# Patient Record
Sex: Male | Born: 1975 | Race: White | Hispanic: No | Marital: Married | Smoking: Former smoker
Health system: Southern US, Community
[De-identification: ages and names within clinical notes are randomized; demographics above are authoritative.]

## PROBLEM LIST (undated history)

## (undated) ENCOUNTER — Emergency Department: Admission: EM | Payer: No Typology Code available for payment source | Source: Home / Self Care

## (undated) DIAGNOSIS — M549 Dorsalgia, unspecified: Secondary | ICD-10-CM

## (undated) DIAGNOSIS — J449 Chronic obstructive pulmonary disease, unspecified: Secondary | ICD-10-CM

## (undated) DIAGNOSIS — I509 Heart failure, unspecified: Secondary | ICD-10-CM

## (undated) DIAGNOSIS — F419 Anxiety disorder, unspecified: Secondary | ICD-10-CM

## (undated) DIAGNOSIS — F4310 Post-traumatic stress disorder, unspecified: Secondary | ICD-10-CM

## (undated) HISTORY — PX: HAND SURGERY: SHX662

## (undated) HISTORY — PX: FACIAL COSMETIC SURGERY: SHX629

## (undated) HISTORY — PX: BACK SURGERY: SHX140

---

## 2005-04-19 ENCOUNTER — Emergency Department: Payer: Self-pay | Admitting: Emergency Medicine

## 2005-05-25 ENCOUNTER — Emergency Department: Payer: Self-pay | Admitting: Emergency Medicine

## 2005-10-09 ENCOUNTER — Emergency Department: Payer: Self-pay | Admitting: Emergency Medicine

## 2006-01-06 ENCOUNTER — Emergency Department: Payer: Self-pay | Admitting: General Practice

## 2007-05-30 ENCOUNTER — Emergency Department: Payer: Self-pay

## 2007-07-06 ENCOUNTER — Emergency Department: Payer: Self-pay | Admitting: Emergency Medicine

## 2008-04-18 ENCOUNTER — Emergency Department: Payer: Self-pay | Admitting: Emergency Medicine

## 2008-04-22 ENCOUNTER — Ambulatory Visit: Payer: Self-pay

## 2008-04-24 ENCOUNTER — Emergency Department: Payer: Self-pay | Admitting: Unknown Physician Specialty

## 2008-09-17 ENCOUNTER — Emergency Department: Payer: Self-pay | Admitting: Emergency Medicine

## 2009-04-29 ENCOUNTER — Emergency Department: Payer: Self-pay | Admitting: Emergency Medicine

## 2009-05-22 ENCOUNTER — Emergency Department: Payer: Self-pay | Admitting: Emergency Medicine

## 2009-07-06 ENCOUNTER — Emergency Department: Payer: Self-pay | Admitting: Emergency Medicine

## 2009-12-17 ENCOUNTER — Ambulatory Visit: Payer: Self-pay | Admitting: Pain Medicine

## 2010-01-30 ENCOUNTER — Emergency Department (HOSPITAL_COMMUNITY): Admission: EM | Admit: 2010-01-30 | Discharge: 2010-01-30 | Payer: Self-pay | Admitting: Emergency Medicine

## 2010-04-08 ENCOUNTER — Emergency Department: Payer: Self-pay | Admitting: Emergency Medicine

## 2010-05-03 ENCOUNTER — Emergency Department: Payer: Self-pay | Admitting: Emergency Medicine

## 2010-06-17 ENCOUNTER — Inpatient Hospital Stay: Payer: Self-pay | Admitting: Psychiatry

## 2010-06-23 ENCOUNTER — Emergency Department: Payer: Self-pay | Admitting: Emergency Medicine

## 2012-02-05 ENCOUNTER — Emergency Department: Payer: Self-pay | Admitting: Emergency Medicine

## 2012-02-08 ENCOUNTER — Emergency Department: Payer: Self-pay | Admitting: Emergency Medicine

## 2012-02-14 ENCOUNTER — Emergency Department: Payer: Self-pay | Admitting: Emergency Medicine

## 2012-03-11 ENCOUNTER — Emergency Department: Payer: Self-pay | Admitting: Emergency Medicine

## 2012-07-01 ENCOUNTER — Emergency Department: Payer: Self-pay | Admitting: Internal Medicine

## 2012-07-01 LAB — CBC
HGB: 15.3 g/dL (ref 13.0–18.0)
MCHC: 32.3 g/dL (ref 32.0–36.0)
MCV: 92 fL (ref 80–100)
RBC: 5.16 10*6/uL (ref 4.40–5.90)
RDW: 13.5 % (ref 11.5–14.5)
WBC: 10.5 10*3/uL (ref 3.8–10.6)

## 2012-07-01 LAB — URINALYSIS, COMPLETE
Bacteria: NONE SEEN
Glucose,UR: NEGATIVE mg/dL (ref 0–75)
Leukocyte Esterase: NEGATIVE
Nitrite: NEGATIVE
Ph: 7 (ref 4.5–8.0)
Protein: NEGATIVE
Specific Gravity: 1.002 (ref 1.003–1.030)
WBC UR: <1 /HPF (ref 0–5)

## 2012-07-01 LAB — COMPREHENSIVE METABOLIC PANEL
Alkaline Phosphatase: 63 U/L (ref 50–136)
BUN: 13 mg/dL (ref 7–18)
Bilirubin,Total: 0.5 mg/dL (ref 0.2–1.0)
EGFR (African American): >60
EGFR (Non-African Amer.): >60
Glucose: 72 mg/dL (ref 65–99)
Osmolality: 280 (ref 275–301)
Potassium: 3.9 mmol/L (ref 3.5–5.1)
SGOT(AST): 26 U/L (ref 15–37)
SGPT (ALT): 44 U/L

## 2012-07-30 ENCOUNTER — Emergency Department: Payer: Self-pay | Admitting: Emergency Medicine

## 2012-07-30 LAB — COMPREHENSIVE METABOLIC PANEL
Albumin: 3.9 g/dL (ref 3.4–5.0)
Alkaline Phosphatase: 64 U/L (ref 50–136)
BUN: 19 mg/dL — ABNORMAL HIGH (ref 7–18)
Bilirubin,Total: 0.6 mg/dL (ref 0.2–1.0)
Chloride: 103 mmol/L (ref 98–107)
Co2: 29 mmol/L (ref 21–32)
Creatinine: 1.05 mg/dL (ref 0.60–1.30)
EGFR (African American): >60
Glucose: 92 mg/dL (ref 65–99)
Osmolality: 283 (ref 275–301)
SGOT(AST): 27 U/L (ref 15–37)
SGPT (ALT): 52 U/L (ref 12–78)
Sodium: 141 mmol/L (ref 136–145)
Total Protein: 7.4 g/dL (ref 6.4–8.2)

## 2012-07-30 LAB — URINALYSIS, COMPLETE
Bacteria: NONE SEEN
Bilirubin,UR: NEGATIVE
Glucose,UR: NEGATIVE mg/dL (ref 0–75)
RBC,UR: 11 /HPF (ref 0–5)
Specific Gravity: 1.021 (ref 1.003–1.030)
Squamous Epithelial: 1
WBC UR: 4 /HPF (ref 0–5)

## 2012-07-30 LAB — CBC WITH DIFFERENTIAL/PLATELET
Basophil %: 0.7 %
Eosinophil %: 3.8 %
HGB: 15.2 g/dL (ref 13.0–18.0)
Lymphocyte %: 27.2 %
MCH: 30.6 pg (ref 26.0–34.0)
MCV: 90 fL (ref 80–100)
Monocyte #: 0.6 x10 3/mm (ref 0.2–1.0)
Monocyte %: 9.5 %
Neutrophil %: 58.8 %

## 2012-10-09 ENCOUNTER — Emergency Department: Payer: Self-pay | Admitting: Emergency Medicine

## 2013-01-24 ENCOUNTER — Emergency Department: Payer: Self-pay | Admitting: Unknown Physician Specialty

## 2013-01-24 LAB — COMPREHENSIVE METABOLIC PANEL
Alkaline Phosphatase: 79 U/L (ref 50–136)
Anion Gap: 2 — ABNORMAL LOW (ref 7–16)
BUN: 9 mg/dL (ref 7–18)
Bilirubin,Total: 0.7 mg/dL (ref 0.2–1.0)
Calcium, Total: 9.6 mg/dL (ref 8.5–10.1)
Chloride: 105 mmol/L (ref 98–107)
Creatinine: 1.05 mg/dL (ref 0.60–1.30)
EGFR (African American): >60
EGFR (Non-African Amer.): >60
Glucose: 95 mg/dL (ref 65–99)
Osmolality: 276 (ref 275–301)
SGOT(AST): 19 U/L (ref 15–37)
SGPT (ALT): 41 U/L (ref 12–78)
Sodium: 139 mmol/L (ref 136–145)
Total Protein: 7.6 g/dL (ref 6.4–8.2)

## 2013-01-24 LAB — URINALYSIS, COMPLETE
Glucose,UR: NEGATIVE mg/dL (ref 0–75)
Ketone: NEGATIVE
Leukocyte Esterase: NEGATIVE
Ph: 6 (ref 4.5–8.0)
RBC,UR: 7292 /HPF (ref 0–5)
Squamous Epithelial: NONE SEEN
WBC UR: 23 /HPF (ref 0–5)

## 2013-01-24 LAB — CBC
MCH: 30.4 pg (ref 26.0–34.0)
MCV: 90 fL (ref 80–100)
Platelet: 273 10*3/uL (ref 150–440)
RBC: 5.23 10*6/uL (ref 4.40–5.90)

## 2013-01-26 ENCOUNTER — Emergency Department: Payer: Self-pay | Admitting: Emergency Medicine

## 2013-01-27 LAB — URINALYSIS, COMPLETE
Bacteria: NONE SEEN
Bilirubin,UR: NEGATIVE
Protein: 100
Specific Gravity: 1.018 (ref 1.003–1.030)
Squamous Epithelial: NONE SEEN
WBC UR: 9 /HPF (ref 0–5)

## 2013-02-06 ENCOUNTER — Emergency Department: Payer: Self-pay | Admitting: Emergency Medicine

## 2013-02-06 LAB — COMPREHENSIVE METABOLIC PANEL
Alkaline Phosphatase: 68 U/L (ref 50–136)
Anion Gap: 5 — ABNORMAL LOW (ref 7–16)
BUN: 20 mg/dL — ABNORMAL HIGH (ref 7–18)
Bilirubin,Total: 0.3 mg/dL (ref 0.2–1.0)
Calcium, Total: 8.5 mg/dL (ref 8.5–10.1)
Chloride: 107 mmol/L (ref 98–107)
Creatinine: 1.16 mg/dL (ref 0.60–1.30)
EGFR (African American): >60
Potassium: 3.7 mmol/L (ref 3.5–5.1)
SGPT (ALT): 37 U/L (ref 12–78)
Total Protein: 7.2 g/dL (ref 6.4–8.2)

## 2013-02-06 LAB — URINALYSIS, COMPLETE
Bacteria: NONE SEEN
Ketone: NEGATIVE
Protein: NEGATIVE
RBC,UR: 381 /HPF (ref 0–5)
Specific Gravity: 1.016 (ref 1.003–1.030)
Squamous Epithelial: <1
WBC UR: 2 /HPF (ref 0–5)

## 2013-02-06 LAB — CBC
HCT: 43.9 % (ref 40.0–52.0)
HGB: 14.8 g/dL (ref 13.0–18.0)
MCHC: 33.8 g/dL (ref 32.0–36.0)
Platelet: 309 10*3/uL (ref 150–440)
RBC: 4.89 10*6/uL (ref 4.40–5.90)
RDW: 14.4 % (ref 11.5–14.5)
WBC: 12.4 10*3/uL — ABNORMAL HIGH (ref 3.8–10.6)

## 2014-06-16 ENCOUNTER — Inpatient Hospital Stay: Payer: Self-pay | Admitting: Internal Medicine

## 2014-06-16 LAB — CBC
HCT: 52.4 % — ABNORMAL HIGH (ref 40.0–52.0)
HGB: 17.1 g/dL (ref 13.0–18.0)
MCH: 30.1 pg (ref 26.0–34.0)
MCHC: 32.7 g/dL (ref 32.0–36.0)
MCV: 92 fL (ref 80–100)
Platelet: 241 10*3/uL (ref 150–440)
RBC: 5.68 10*6/uL (ref 4.40–5.90)
RDW: 13.7 % (ref 11.5–14.5)
WBC: 4.4 10*3/uL (ref 3.8–10.6)

## 2014-06-16 LAB — BASIC METABOLIC PANEL
ANION GAP: 6 — AB (ref 7–16)
BUN: 11 mg/dL (ref 7–18)
CREATININE: 1.44 mg/dL — AB (ref 0.60–1.30)
Calcium, Total: 8.9 mg/dL (ref 8.5–10.1)
Chloride: 106 mmol/L (ref 98–107)
Co2: 29 mmol/L (ref 21–32)
EGFR (African American): >60
Glucose: 82 mg/dL (ref 65–99)
Osmolality: 280 (ref 275–301)
Potassium: 3.8 mmol/L (ref 3.5–5.1)
Sodium: 141 mmol/L (ref 136–145)

## 2014-06-16 LAB — TROPONIN I

## 2014-06-17 LAB — CBC WITH DIFFERENTIAL/PLATELET
Basophil #: 0 10*3/uL (ref 0.0–0.1)
Basophil %: 0.2 %
Eosinophil #: 0 10*3/uL (ref 0.0–0.7)
Eosinophil %: 0.1 %
HCT: 44.6 % (ref 40.0–52.0)
HGB: 14.5 g/dL (ref 13.0–18.0)
LYMPHS PCT: 8.3 %
Lymphocyte #: 1.1 10*3/uL (ref 1.0–3.6)
MCH: 29.5 pg (ref 26.0–34.0)
MCHC: 32.5 g/dL (ref 32.0–36.0)
MCV: 91 fL (ref 80–100)
MONO ABS: 0.8 x10 3/mm (ref 0.2–1.0)
Monocyte %: 6.5 %
Neutrophil #: 10.7 10*3/uL — ABNORMAL HIGH (ref 1.4–6.5)
Neutrophil %: 84.9 %
PLATELETS: 228 10*3/uL (ref 150–440)
RBC: 4.92 10*6/uL (ref 4.40–5.90)
RDW: 13.4 % (ref 11.5–14.5)
WBC: 12.6 10*3/uL — ABNORMAL HIGH (ref 3.8–10.6)

## 2014-06-17 LAB — BASIC METABOLIC PANEL
ANION GAP: 6 — AB (ref 7–16)
BUN: 13 mg/dL (ref 7–18)
Calcium, Total: 8.3 mg/dL — ABNORMAL LOW (ref 8.5–10.1)
Chloride: 110 mmol/L — ABNORMAL HIGH (ref 98–107)
Co2: 25 mmol/L (ref 21–32)
Creatinine: 1.13 mg/dL (ref 0.60–1.30)
EGFR (African American): >60
EGFR (Non-African Amer.): >60
Glucose: 119 mg/dL — ABNORMAL HIGH (ref 65–99)
Osmolality: 283 (ref 275–301)
Potassium: 3.7 mmol/L (ref 3.5–5.1)
Sodium: 141 mmol/L (ref 136–145)

## 2014-06-21 ENCOUNTER — Emergency Department: Payer: Self-pay | Admitting: Emergency Medicine

## 2014-06-21 LAB — URINALYSIS, COMPLETE
BILIRUBIN, UR: NEGATIVE
Blood: NEGATIVE
Glucose,UR: NEGATIVE mg/dL (ref 0–75)
Ketone: NEGATIVE
Leukocyte Esterase: NEGATIVE
NITRITE: NEGATIVE
Ph: 5 (ref 4.5–8.0)
Protein: NEGATIVE
RBC,UR: 4 /HPF (ref 0–5)
SPECIFIC GRAVITY: 1.023 (ref 1.003–1.030)
Squamous Epithelial: <1
WBC UR: 3 /HPF (ref 0–5)

## 2014-06-21 LAB — ETHANOL
Ethanol %: <0.003 % (ref 0.000–0.080)
Ethanol: <3 mg/dL

## 2014-06-21 LAB — SALICYLATE LEVEL: Salicylates, Serum: <1.7 mg/dL

## 2014-06-21 LAB — CBC
HCT: 53.1 % — AB (ref 40.0–52.0)
HGB: 17.9 g/dL (ref 13.0–18.0)
MCH: 30.5 pg (ref 26.0–34.0)
MCHC: 33.7 g/dL (ref 32.0–36.0)
MCV: 90 fL (ref 80–100)
Platelet: 238 10*3/uL (ref 150–440)
RBC: 5.87 10*6/uL (ref 4.40–5.90)
RDW: 13.4 % (ref 11.5–14.5)
WBC: 4.9 10*3/uL (ref 3.8–10.6)

## 2014-06-21 LAB — ACETAMINOPHEN LEVEL: Acetaminophen: <2 ug/mL

## 2014-06-21 LAB — DRUG SCREEN, URINE
Amphetamines, Ur Screen: NEGATIVE (ref ?–1000)
Barbiturates, Ur Screen: NEGATIVE (ref ?–200)
Benzodiazepine, Ur Scrn: POSITIVE (ref ?–200)
CANNABINOID 50 NG, UR ~~LOC~~: NEGATIVE (ref ?–50)
Cocaine Metabolite,Ur ~~LOC~~: POSITIVE (ref ?–300)
MDMA (ECSTASY) UR SCREEN: NEGATIVE (ref ?–500)
Methadone, Ur Screen: NEGATIVE (ref ?–300)
Opiate, Ur Screen: NEGATIVE (ref ?–300)
PHENCYCLIDINE (PCP) UR S: NEGATIVE (ref ?–25)
TRICYCLIC, UR SCREEN: NEGATIVE (ref ?–1000)

## 2014-06-21 LAB — COMPREHENSIVE METABOLIC PANEL
ALT: 53 U/L (ref 12–78)
AST: 32 U/L (ref 15–37)
Albumin: 3.6 g/dL (ref 3.4–5.0)
Alkaline Phosphatase: 58 U/L
Anion Gap: 4 — ABNORMAL LOW (ref 7–16)
BUN: 25 mg/dL — ABNORMAL HIGH (ref 7–18)
Bilirubin,Total: 0.9 mg/dL (ref 0.2–1.0)
CALCIUM: 8.7 mg/dL (ref 8.5–10.1)
Chloride: 105 mmol/L (ref 98–107)
Co2: 30 mmol/L (ref 21–32)
Creatinine: 1.46 mg/dL — ABNORMAL HIGH (ref 0.60–1.30)
EGFR (African American): >60
EGFR (Non-African Amer.): >60
GLUCOSE: 89 mg/dL (ref 65–99)
OSMOLALITY: 281 (ref 275–301)
Potassium: 4.2 mmol/L (ref 3.5–5.1)
SODIUM: 139 mmol/L (ref 136–145)
Total Protein: 7.5 g/dL (ref 6.4–8.2)

## 2014-06-21 LAB — CULTURE, BLOOD (SINGLE)

## 2014-06-30 ENCOUNTER — Emergency Department: Payer: Self-pay | Admitting: Emergency Medicine

## 2015-04-05 NOTE — Consult Note (Signed)
PATIENT NAME:  Reginald Hopkins, Reginald Hopkins MR#:  [id number] DATE OF BIRTH:  [birth year]  DATE OF CONSULTATION:  06/22/2014  CONSULTING PHYSICIAN:  Audery Amel, MD  IDENTIFYING INFORMATION AND REASON FOR CONSULTATION: A 39 year old gentleman who came to the Emergency Room requesting "help" because of substance abuse. Consultation for appropriate psychiatric management.   HISTORY OF PRESENT ILLNESS: Information obtained from the patient and the old chart. The patient says that day before yesterday, he used $350 worth of cocaine. This was a relapse which he claims was the first time after being sober for 3 months after getting out of jail. He says that he is feeling anxious and upset. When he came into the Emergency Room, he had made some statements about how he was trying to use enough cocaine to help himself in order to kill himself.   On interview today, the patient states that he has no suicidal ideation or intention to hurt himself or wish to die. The patient tells me that he has nightmares at times about hurting his ex because she has a 50B restraining order out on him, but he is able to articulate an understanding of why he would not do that, and he does not want to go back to jail and says he has no intention or plan of doing it. He describes his mood as being chronically anxious. He denies auditory or visual hallucinations. Says that he drinks 3 or 4 beers every day and does not see any problem in it.   PAST PSYCHIATRIC HISTORY: The patient has a history of overdose on clonazepam in the past.  Not clear if there was any intent to harm himself at that time. He has been to [facility] in the past, which he said was also around drug abuse. He says that in the past, he has been tried on several serotonin reuptake inhibitors, but they do not help him. He has a particularly fondness for clonazepam. He has overdosed on it on the past. I note from prescribing database that after getting clonazepam on discharge from  the hospital just the other day, he got another prescription within a couple of days. 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 Says that he has problems with his memory and has chronic irritability.   PAST MEDICAL HISTORY: Had a traumatic brain injury and also has had some orthopedic injuries. Complains of chronic pain from it. He was in the hospital just the other day with a respiratory infection and has chronic bronchitis.   SOCIAL HISTORY: Lives in an apartment. He says he spends most of his time doing nothing but playing pool. Does not have any family that he stays in touch with. He is on disability and does not work.   SUBSTANCE ABUSE HISTORY: History of longstanding abuse of alcohol and cocaine. Has been abusing benzodiazepines in the past evidenced by overdose.   REVIEW OF SYSTEMS: The patient describes himself as nervous. Denies suicidal or homicidal ideation. Denies any hallucinations or delusions. Denies any intention to do anything dangerous to himself or others. He is complaining of chronic back pain. Long-standing issue. The rest of the physical review of systems is negative.   MENTAL STATUS EXAMINATION: This is somewhat disheveled gentleman who looks his stated age who is generally cooperative with the interview. Eye contact good. Psychomotor activity a little bit fidgety. Speech is loud at times, but fairly normal in rate and volume. Affect is a little bit anxious and a little bit  irritated. Mood is stated as being nervous. Thoughts are simplistic and kind of concrete. He is unable to really articulate what he thinks would be a clear help for him going forward. He denies hallucinations. Denies suicidal or homicidal intent or plan. The patient is alert and oriented x4. He describes having chronic memory problems. Basic fund of knowledge probably within the normal range given his lack of education.   CURRENT MEDICATIONS: Apparently none.   ALLERGIES: No known drug allergies.    VITAL SIGNS: Blood pressure 119/65, respirations 18, pulse 69, temperature 98.6.   EKG is normal.   LABORATORY DATA:  CBC unremarkable, slightly elevated hematocrit not dramatically so. Creatinine slightly up at 1.44. Today, salicylates and acetaminophen normal. Alcohol level was negative. Creatinine still a little bit up at 1.46, BUN up at 25. Urinalysis unremarkable. Drug screen positive for cocaine and benzodiazepines.   ASSESSMENT: A 39 year old gentleman who describes chronic anxiety and has a clear long history of substance abuse. His reason for presenting here to the hospital is ostensibly because of a relapse into cocaine. The patient has not been making any effort to go to any outpatient treatment outside the hospital. There is evidence here that he is going to more than one provider to get prescriptions of clonazepam. The patient is unable to really articulate what he thinks will help him. I advised him that perhaps the most helpful thing we could do would be to refer him to the alcohol and drug abuse treatment center in [place]. He absolutely refuses to consider that saying he has been there before and it will not help him. I asked him what he thought would be helpful about the ward downstairs and he said that it would be good to just get away from society. He is not interested in any medication other than benzodiazepines.   At this point, the patient is not acutely dangerous and not psychotic. He has poor insight about his substance abuse and is not interested in involving himself in appropriate treatment. I educated him that the most useful thing he could do would be to stay away from cocaine, decrease his alcohol use and get involved with a local mental health programs such as [facility]. At this point, I think he can be released from the Emergency Room. We will give him a referral to follow up with [facility], which he can contact beginning Monday morning.   DIAGNOSIS, PRINCIPAL AND PRIMARY:  AXIS I:  Cocaine abuse.   SECONDARY DIAGNOSES: AXIS I: Substance-induced mood disorder.  AXIS II: Deferred.  AXIS III: Chronic pain, past history of head injury of unclear significance.  AXIS IV: Moderate from chronic isolation and lack of support.  AXIS V: Functioning at time of assessment is a 50.   ____________________________ Audery AmelJohn T. Clapacs, MD jtc:ds D: 06/22/2014 18:31:29 ET T: 06/22/2014 18:53:08 ET JOB#: [id number]  cc: Audery AmelJohn T. Clapacs, MD, <Dictator> Audery AmelJOHN T CLAPACS MD ELECTRONICALLY SIGNED 06/30/2014 12:39

## 2015-04-05 NOTE — H&P (Signed)
PATIENT NAME:  Reginald Hopkins, Reginald Hopkins MR#:  [id number] DATE OF BIRTH:  1976-05-11  DATE OF ADMISSION:  06/16/2014.  PRIMARY CARE PHYSICIAN: Nonlocal.   REFERRING PHYSICIAN:  Dr. Dolores Frame.   CHIEF COMPLAINT:  A 1- month history of cough.   HISTORY OF PRESENT ILLNESS: The patient is a 39 year old Caucasian male with OCD, posttraumatic stress disorder, who was just released from jail on March 31.  He is complaining of a 56-month history of cough. He is reporting it is intermittent in nature, associated with some clear phlegm. He denies any blood. He was seen by a physician and treated with Z-Pak approximately 2  weeks ago with no significant improvement. He is not quite sure whether he is developing any fever or not. Denies any chills or rigors, was complaining of body pains. He denies any sick contacts. No history of tuberculosis in the past. He reports he has decreased appetite for the past 3 to 4 weeks and has lost 15 pounds in the past 1 month unintentionally. No similar complaints in the past.   Chest x-ray has revealed probably developing interstitial infiltrates. The patient had blood cultures obtained and he was given IV levofloxacin in the ED.  Hospitalist team was called to admit the patient. During my examination, the patient is worried about his weight loss but at the same time he is reporting that he is feeling hungry now. No other complaints.   PAST MEDICAL HISTORY: Obsessive-compulsive disorder, posttraumatic stress disorder, chronic history of anxiety, history of kidney stones.   PAST SURGICAL HISTORY: Hand surgery, facial reconstruction.   ALLERGIES: No known drug allergies.   MEDICATIONS: Not on any home medications.   PSYCHOSOCIAL HISTORY:  Lives at home, lives alone. Denies any history of smoking, occasional intake of alcohol. Denies any drugs.   FAMILY HISTORY: Diabetes and asthma.    REVIEW OF SYSTEMS:  CONSTITUTIONAL: Denies any fever or fatigue. Complaining of a 15-pound weight loss in  one month. HEENT: Normocephalic, denies any epistaxis, discharge, tinnitus.  LUNGS: Complaining of chronic cough which is productive with clear phlegm. Denies any COPD or asthma. Denies any shortness of breath. No chest pain.  CARDIAC: Denies any chest pain, palpitations, dizziness, loss of consciousness. No peripheral edema.  GASTROINTESTINAL: Denies nausea, vomiting, but complaining of significant weight loss of 15 pounds which was unintentional. No hematemesis or melena.  NEUROLOGICAL: Denies any vertigo, ataxia. Denies any history of strokes or TIAs in the past.  PSYCHIATRIC: Has OCD and posttraumatic stress disorder . Chronic history of anxiety is also present.   GENITOURINARY:  Denies any polyuria, nocturia.  ENDOCRINE:  No heat or cold intolerance.  MUSCULOSKELETAL: Denies any sort of pain. Complaining of chronic low back pain. No history of gout.   PHYSICAL EXAMINATION:  VITAL SIGNS: Temperature 98, pulse 92, respirations 18 to 20, blood pressure 109/60, pulse oximetry is 97% on room air.  GENERAL APPEARANCE: Not in acute distress. Moderately built and thin-looking male in no apparent distress.  HEENT: Normocephalic, atraumatic. Pupils are equally reacting to light and accommodation. No scleral icterus. No conjunctival injection. No sinus tenderness,  No postnasal drip.  NECK: Supple. No JVD or thyromegaly. Range of motion is intact.  LUNGS: Bilateral air entry with decreased breath sounds at the bases. Posterior bronchial breath sounds. No crackles. No rales or rhonchi.  CARDIAC: S1 and S2 normal. Regular rate and rhythm. No murmurs.  GASTROINTESTINAL:  Positive bowel sounds in all quad four quadrants. Nontender, nondistended. No hepatosplenomegaly. No masses.  NEUROLOGICA:  Alert oriented x3. Cranial nerves II-XII are grossly intact. Motor and sensory are intact. Reflexes are 2+.  EXTREMITIES: No edema. No cyanosis. No clubbing.  SKIN: Warm to touch. Normal turgor. No rashes. No  lesions.  MUSCULOSKELETAL: No joint effusion, tenderness.   PSYCHIATRIC: Normal mood and affect.   LABORATORY AND IMAGING STUDIES: Troponin less than 0.02, CBC is normal except hematocrit 52.4. Chem-8 is normal except creatinine at 1.44   Chest x-ray PA and lateral views show no active cardiopulmonary disease, emphysema, and a 9-mm right-sided nodule; recommended not non-emergent chest CT.   A 12-lead EKG shows normal sinus rhythm with no acute ST-T wave changes.   ASSESSMENT: A 39 year old Caucasian male who was released from jail on March 31, he claims no sick contacts. Has been coughing for the past 1 month, status post outpatient treatment with Z-Pak 2 weeks ago with no significant relief.  Claiming A15-pound weight loss in 30 days, which was unintentional, probably from decreased appetite from the sickness.   PLAN:   1.  Persistent cough, probably from acute bronchitis versus atypical pneumonia. Pancultures were obtained. The patient will be on IV levofloxacin. Sputum cultures are pending. We will provide nebulizer treatments on an as-needed basis.  2.  Failure to thrive.  Probably from anorexia secondary to problem number 1. PPD test is ordered, which needs to be followed up. We will place a dietitian consult regarding calorie count and check prealbumin.  3.  Chronic history of anxiety. We will provide Xanax as needed.  4.  Pulmonary nodule.  Will order CT of the chest and also consider repeating CT chest in 3 months.  5.  Chronic history of obsessive-compulsive disorder and posttraumatic stress disorder. The patient is not on any home medications.  6.  Acute kidney injury. We will provide IV fluids.  7.  We will provide GI and DVT prophylaxis.   CODE STATUS:  Full code.   Plan of care discussed with the patient.   TOTAL TIME SPENT ON ADMISSION:  50 minutes.    ____________________________ Ramonita LabAruna Lilliah Priego, MD ag:lt D: 06/16/2014 07:29:14 ET T: 06/16/2014 07:45:05  ET JOB#: [id number]  cc: Ramonita LabAruna Jael Waldorf, MD, <Dictator> Ramonita LabARUNA Adasha Boehme MD ELECTRONICALLY SIGNED 06/18/2014 0:39

## 2015-04-05 NOTE — Consult Note (Signed)
PATIENT NAME:  Reginald Hopkins, Reginald Hopkins MR#:  [id number] DATE OF BIRTH:  [birth year]  DATE OF CONSULTATION:  06/16/2014  REFERRING PHYSICIAN:   CONSULTING PHYSICIAN:  Audianna Landgren K. Guss Bundehalla, MD  PLACE OF DICTATION: [facility], Room #211-AA.  AGE: 39 years.  SEX: Male.  RACE: White.  SUBJECTIVE: The patient was seen in consultation in room #211-AA, [facility], [place], [place] . The patient is a 39 year old white male, not employed, and is on disability because of status post brain injury secondary to status post TBI. The patient is not married and lives by himself in an apartment. The [facility] with the chief complaint of "breathing problems." The patient reports that he was told that he has pneumonia and bronchitis and he is using oxygen for the same. The patient requested that he be started on Klonopin. The patient reports that he has been going to a mental health clinic in [place] and they put him on Klonopin 4 times a day, which has really helped him with his anxiety. However, he lost his Medicaid and so he could not go back for his appointments.  PAST PSYCHIATRIC HISTORY: History of inpatient hold on psychiatry many years ago and suicide attempt at age 39 years. Was being followed by psychiatrist until several months ago when he lost his Medicaid and plans to go back to a different mental health clinic and psychiatrist. The patient reports that he has been on various antidepressant medications which include Celexa, Prozac, and Zoloft which have not helped him. The only medication that does help him is Klonopin which helps him calm down and not stay hyper and helps him rest at night.   ALCOHOL AND DRUGS: Denied drinking alcohol. It is not a problem at this time.   MENTAL STATUS EXAMINATION: The patient is alert and oriented to place, person and time. Appears anxious. The patient reports that this anxiety has been going on for quite some time. Denies feeling depressed except anxiety makes him feel low  and down. Denies suicidal or homicidal plans. No psychosis. Denies auditory or visual hallucinations, delusions or paranoid thinking. Cognition is intact. General knowledge and information is fair. Insight and judgment fair.  IMPRESSION: Status post TBI with anxiety. Recommend starting the patient back on Klonopin 0.5 mg p.o. 3 times a day which has helped him calm down and helps him rest at night. The patient will be followed by psychiatry in the community after discharge for the same.    ____________________________ Jannet MantisSurya K. Guss Bundehalla, MD skc:lm D: 06/16/2014 19:15:25 ET T: 06/16/2014 23:44:12 ET JOB#: [id number]  cc: Monika SalkSurya K. Guss Bundehalla, MD, <Dictator> Beau FannySURYA K Jazier Mcglamery MD ELECTRONICALLY SIGNED 06/17/2014 11:40

## 2015-04-05 NOTE — Discharge Summary (Signed)
PATIENT NAME:  Reginald Hopkins, Reginald Hopkins MR#:  [id number] DATE OF BIRTH:  03-07-1976  DATE OF ADMISSION:  06/16/2014 DATE OF DISCHARGE:  06/17/2014  DISCHARGE DIAGNOSES: 1.  Acute bronchitis.  2.  Emphysema.   3.  Anxiety. 4.  History of anxiety.  DISCHARGE MEDICATIONS:  1.  Tessalon Perles 100 mg every 4 hours as needed for cough. 2.  Klonopin 0.5 mg p.o. every 8 hours. 3.  Prednisone 20 mg  2 tablets daily for 2 days, 1 tablet daily for 2 days and then stop.   4.  Augmentin 1 tablet p.o. b.i.d.  5.  Azithromycin 3 day dose pack at 500 mg p.o. daily.  6.  Patient received Klonopin until July 11, for 5 days, that is 15 tablets. 7.  Azithromycin 3 days and Augmentin was given for 10 days.  8.  The patient also was given Spiriva 1 capsule inhalation daily.   CONSULTATIONS: The [facility]  HOSPITAL COURSE:   1.  A 39 year old male patient who was recently released from the jail comes in because of dry cough. The patient did not have any pneumonia on x-ray. CT chest showed emphysema. The patient admitted for bronchitis and possible atypical pneumonia.  He was initiated on Zithromax and also the patient also was given nebulizers. He was a heavy smoker before, now because he was in jail, he did not smoke. The patient initially required O2 support. He was 92 on 2 liters, but at the time of discharge, saturations have improved to 99% on 4 liters; otherwise, it was stable. The patient did not have any cough, even though he requested Tussionex.    Patient wanted to go home.  Discharged him home with antibiotics, steroids, and also added Spiriva. Advised him to follow up with Dr. Freda MunroSaadat Khan at his earliest convenience because of his emphysema and possibly needing ongoing follow-up with the lung doctor.  The patient admits to having pulmonary function testing.   2.  Anxiety.  Patient requested Klonopin. The patient was off Klonopin for a year, so I did not feel comfortable  discharging on Klonopin. The patient was seen by Dr.  Guss Bundehalla and patient was [facility] 2009 Klonopin overdose.  He has a history of a traumatic brain injury and also history of anxiety. He has a history of substance abuse and tobacco abuse.  So he is started on Klonopin 0.5 mg p.o. t.i.d. to help him with anxiety and restless nights, so given Klonopin, but only a few tablets and he is advised to follow with psychiatry in the community. The patient was given a taxi voucher and  discharged him home. The patient's CBC ,BMP  were within normal limits. His blood cultures were negative, and CT chest showed the patient did not have any mediastinal lymphadenopathy. The patient has emphysema and extensive peripheral blebs, and ( as well as fibrotic changes in the lower lobes bilaterally. The patient was seen by dietitian because of malnutrition. The patient advised to have Carnation milk .  Patient to have Carnation breakfast on meal trays 2 times and advised about the same thing.  Patient discharged home.    Patient says that he is followed by a physician at [facility].  The family advised to follow up with [facility].  He said he is already seeing them as an outpatient.  TIME SPENT: More than 30 minutes.    ____________________________ Katha HammingSnehalatha Shawnetta Lein, MD sk:ts D: 06/18/2014 11:43:44  ET T: 06/18/2014 15:19:17 ET JOB#: [id number]  cc: Katha Hamming, MD, <Dictator> Katha Hamming MD ELECTRONICALLY SIGNED 06/27/2014 20:13

## 2015-09-04 IMAGING — CT CT CHEST W/O CM
2 of 4 series · 15 of 36 positions shown, 18 images · non-contrast
Comparison: Two-view chest 06/16/2014.

CLINICAL DATA: Lung nodule.  Coughing and wheezing.

EXAM:
CT CHEST WITHOUT CONTRAST
TECHNIQUE: Multidetector CT imaging of the chest was performed following the
standard protocol without IV contrast..

[Series 2: routine chest wo · axial · 0.68mm/px · z∈[-546,-251]mm · 12 of 71 slices shown, 15 images]
[im 6/71  mediastinal]
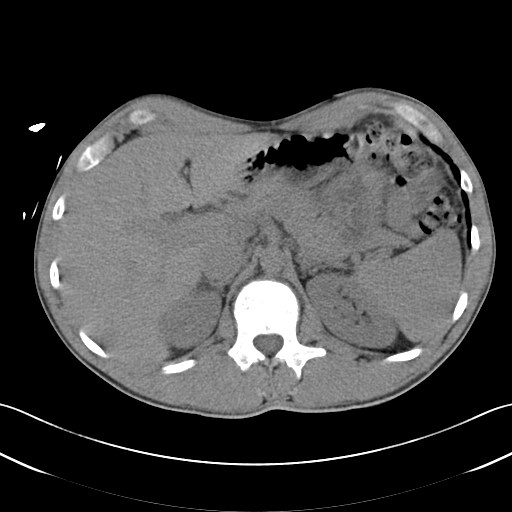
[im 6/71  lung]
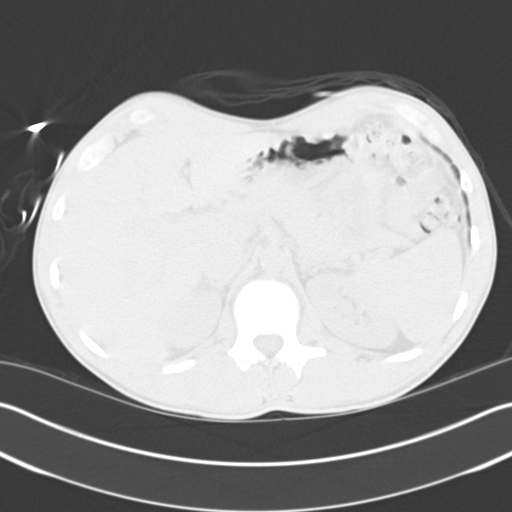
[im 11/71  lung]
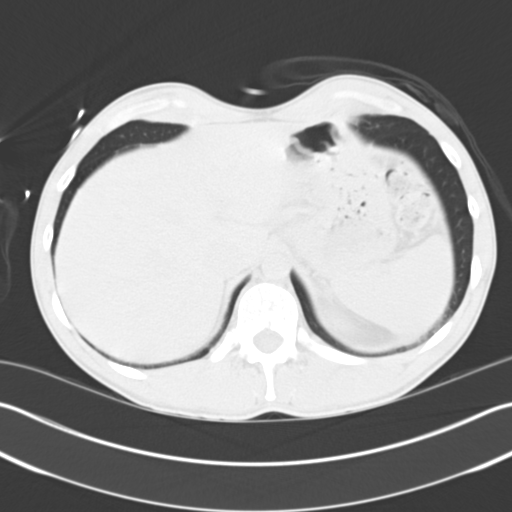
[im 17/71  lung]
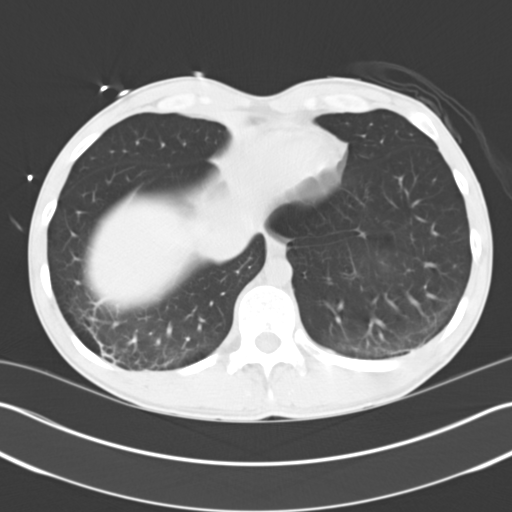
[im 22/71  lung]
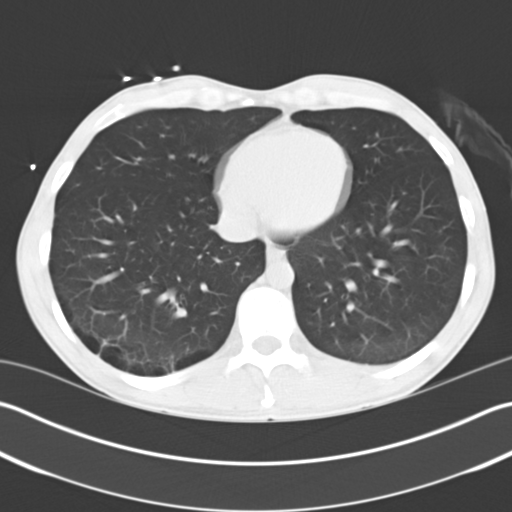
[im 27/71  mediastinal]
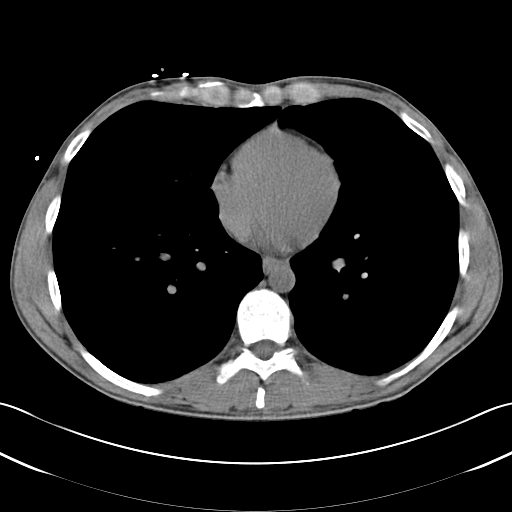
[im 27/71  lung]
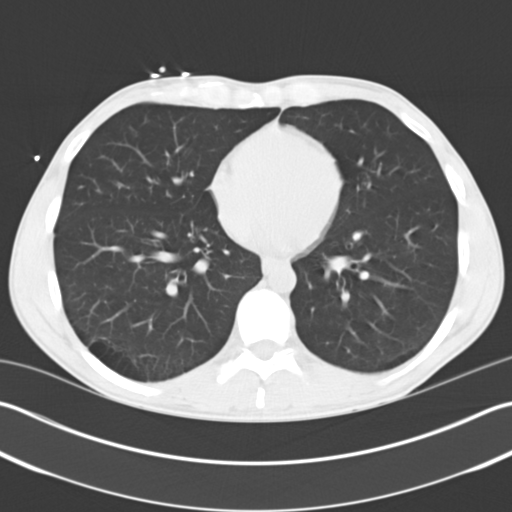
[im 33/71  lung]
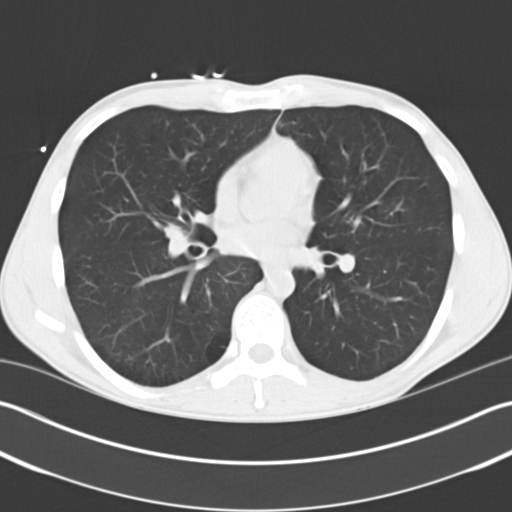
[im 38/71  lung]
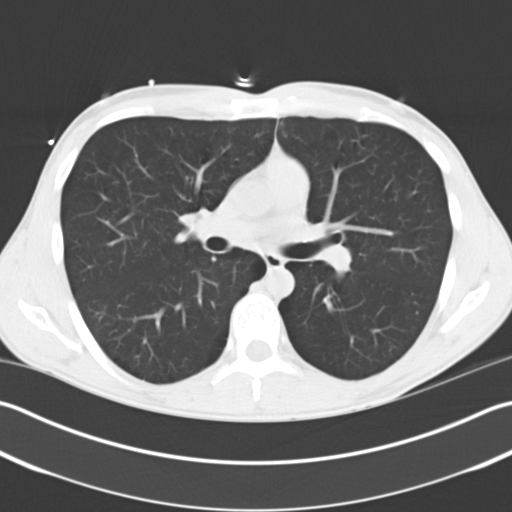
[im 44/71  lung]
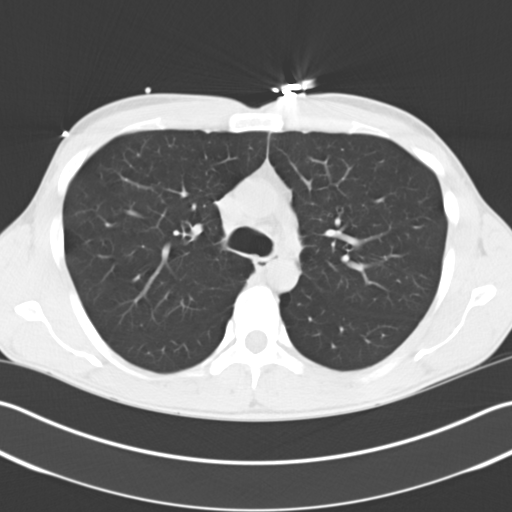
[im 49/71  mediastinal]
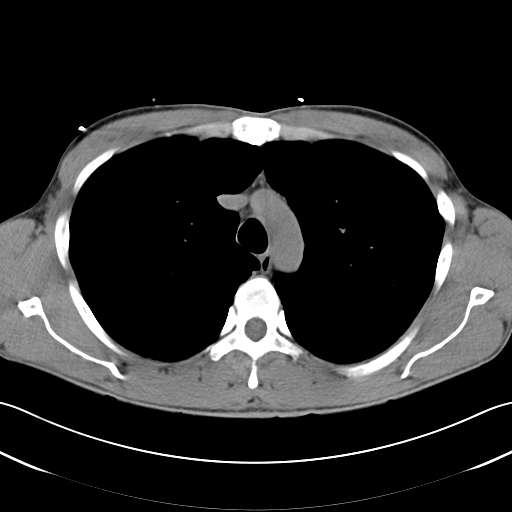
[im 49/71  lung]
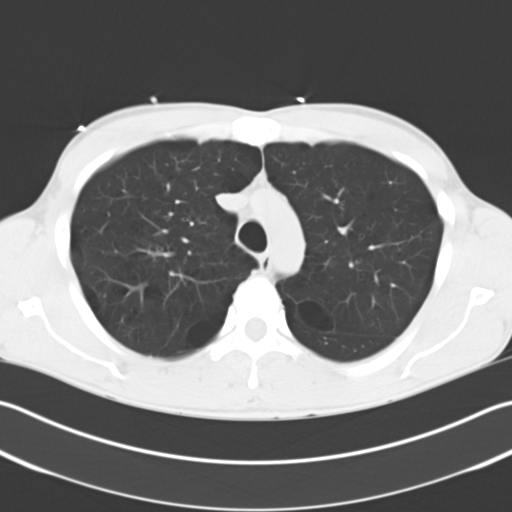
[im 54/71  lung]
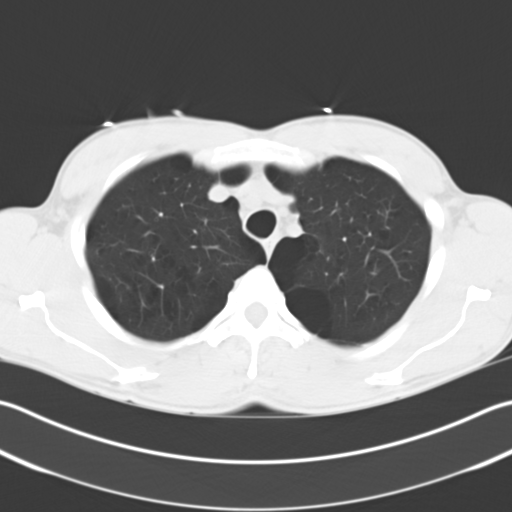
[im 60/71  lung]
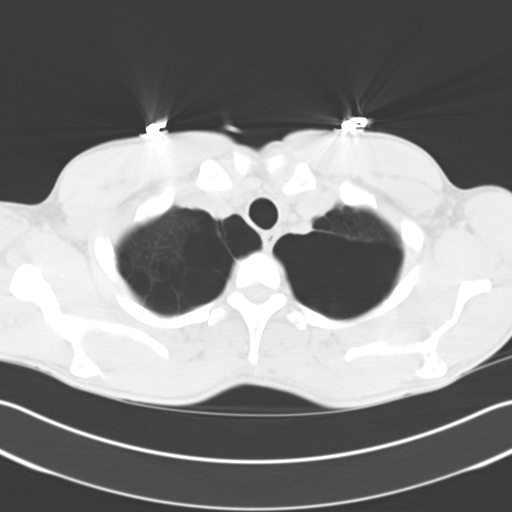
[im 65/71  lung]
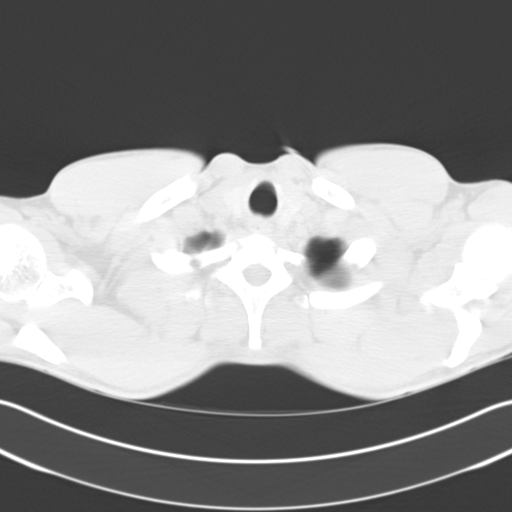

[Series 5: cor routine chest wo · coronal · 0.68mm/px · 3 of 115 slices shown]
[im 23/115  lung]
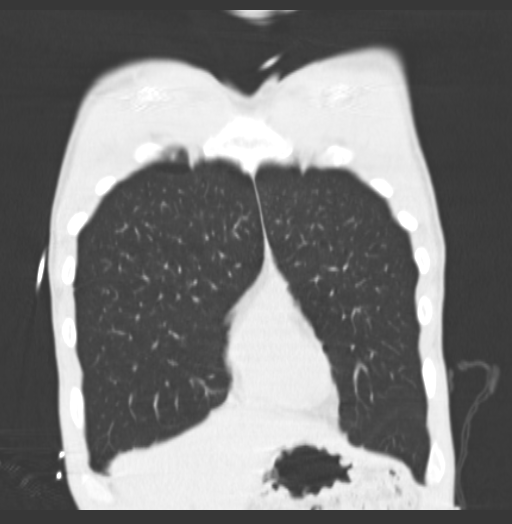
[im 46/115  lung]
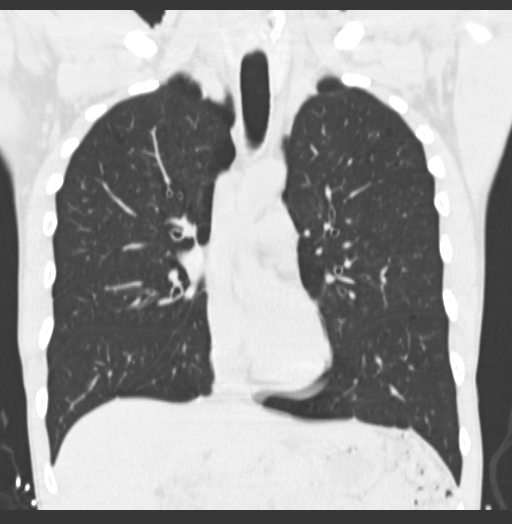
[im 69/115  lung]
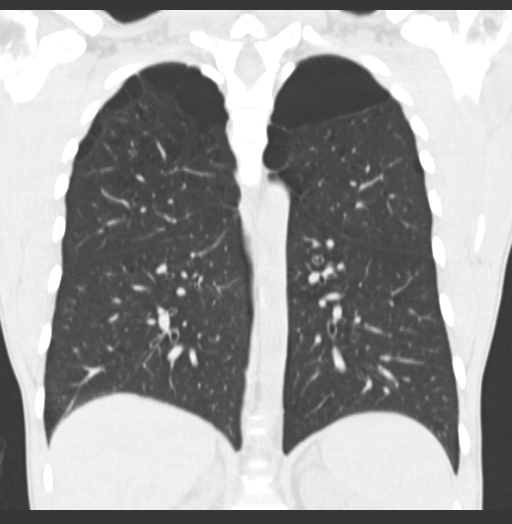

[15 of 36 positions shown; findings below may reference images not displayed]

FINDINGS: No focal nodule is present. The density likely represents a
confluence of shadows. Early centrilobular emphysematous changes are
noted in the upper lobes bilaterally. Extensive peripheral blebs are
noted throughout both lungs. No focal airspace consolidation is
present.

The heart size is normal. No significant mediastinal or axillary
adenopathy is present. Patient is cachectic. Limited imaging of the
upper abdomen is unremarkable.
IMPRESSION: 1. No focal pulmonary nodule.
2. Early centrilobular emphysematous change.
3. Extensive peripheral blebs with early interstitial coarsening
versus fibrotic changes at the lower lobes bilaterally.

## 2016-03-24 ENCOUNTER — Emergency Department: Payer: Medicare Other

## 2016-03-24 ENCOUNTER — Encounter: Payer: Self-pay | Admitting: Emergency Medicine

## 2016-03-24 ENCOUNTER — Emergency Department
Admission: EM | Admit: 2016-03-24 | Discharge: 2016-03-25 | Disposition: A | Discharge status: Home / Self Care | Payer: Medicare Other | Attending: Emergency Medicine | Admitting: Emergency Medicine

## 2016-03-24 DIAGNOSIS — F1410 Cocaine abuse, uncomplicated: Secondary | ICD-10-CM

## 2016-03-24 DIAGNOSIS — F4310 Post-traumatic stress disorder, unspecified: Secondary | ICD-10-CM

## 2016-03-24 DIAGNOSIS — F419 Anxiety disorder, unspecified: Secondary | ICD-10-CM | POA: Diagnosis not present

## 2016-03-24 DIAGNOSIS — J449 Chronic obstructive pulmonary disease, unspecified: Secondary | ICD-10-CM

## 2016-03-24 DIAGNOSIS — Z7951 Long term (current) use of inhaled steroids: Secondary | ICD-10-CM | POA: Insufficient documentation

## 2016-03-24 DIAGNOSIS — F602 Antisocial personality disorder: Secondary | ICD-10-CM

## 2016-03-24 DIAGNOSIS — F1994 Other psychoactive substance use, unspecified with psychoactive substance-induced mood disorder: Secondary | ICD-10-CM | POA: Diagnosis not present

## 2016-03-24 DIAGNOSIS — Z046 Encounter for general psychiatric examination, requested by authority: Secondary | ICD-10-CM | POA: Diagnosis present

## 2016-03-24 DIAGNOSIS — Z87891 Personal history of nicotine dependence: Secondary | ICD-10-CM | POA: Diagnosis not present

## 2016-03-24 HISTORY — DX: Post-traumatic stress disorder, unspecified: F43.10

## 2016-03-24 HISTORY — DX: Dorsalgia, unspecified: M54.9

## 2016-03-24 HISTORY — DX: Chronic obstructive pulmonary disease, unspecified: J44.9

## 2016-03-24 HISTORY — DX: Anxiety disorder, unspecified: F41.9

## 2016-03-24 LAB — URINALYSIS COMPLETE WITH MICROSCOPIC (ARMC ONLY)
BILIRUBIN URINE: NEGATIVE
Bacteria, UA: NONE SEEN
GLUCOSE, UA: NEGATIVE mg/dL
Hgb urine dipstick: NEGATIVE
KETONES UR: NEGATIVE mg/dL
LEUKOCYTES UA: NEGATIVE
NITRITE: NEGATIVE
Protein, ur: NEGATIVE mg/dL
RBC / HPF: NONE SEEN RBC/hpf (ref 0–5)
SPECIFIC GRAVITY, URINE: 1.023 (ref 1.005–1.030)
SQUAMOUS EPITHELIAL / LPF: NONE SEEN
pH: 6 (ref 5.0–8.0)

## 2016-03-24 LAB — URINE DRUG SCREEN, QUALITATIVE (ARMC ONLY)
Amphetamines, Ur Screen: NOT DETECTED
Barbiturates, Ur Screen: NOT DETECTED
Benzodiazepine, Ur Scrn: POSITIVE — AB
COCAINE METABOLITE, UR ~~LOC~~: POSITIVE — AB
Cannabinoid 50 Ng, Ur ~~LOC~~: POSITIVE — AB
MDMA (ECSTASY) UR SCREEN: NOT DETECTED
METHADONE SCREEN, URINE: NOT DETECTED
OPIATE, UR SCREEN: NOT DETECTED
Phencyclidine (PCP) Ur S: NOT DETECTED
TRICYCLIC, UR SCREEN: NOT DETECTED

## 2016-03-24 LAB — CBC
HEMATOCRIT: 45.6 % (ref 40.0–52.0)
HEMOGLOBIN: 15.7 g/dL (ref 13.0–18.0)
MCH: 29.9 pg (ref 26.0–34.0)
MCHC: 34.4 g/dL (ref 32.0–36.0)
MCV: 86.8 fL (ref 80.0–100.0)
Platelets: 246 10*3/uL (ref 150–440)
RBC: 5.26 MIL/uL (ref 4.40–5.90)
RDW: 13.6 % (ref 11.5–14.5)
WBC: 8.3 10*3/uL (ref 3.8–10.6)

## 2016-03-24 LAB — CHLAMYDIA/NGC RT PCR (ARMC ONLY)
CHLAMYDIA TR: DETECTED — AB
N GONORRHOEAE: NOT DETECTED

## 2016-03-24 LAB — COMPREHENSIVE METABOLIC PANEL
ALBUMIN: 4.5 g/dL (ref 3.5–5.0)
ALK PHOS: 60 U/L (ref 38–126)
ALT: 37 U/L (ref 17–63)
ANION GAP: 5 (ref 5–15)
AST: 30 U/L (ref 15–41)
BUN: 22 mg/dL — ABNORMAL HIGH (ref 6–20)
CALCIUM: 9.5 mg/dL (ref 8.9–10.3)
CHLORIDE: 107 mmol/L (ref 101–111)
CO2: 25 mmol/L (ref 22–32)
CREATININE: 1.36 mg/dL — AB (ref 0.61–1.24)
GFR calc non Af Amer: >60 mL/min (ref >60)
GLUCOSE: 105 mg/dL — AB (ref 65–99)
Potassium: 3.9 mmol/L (ref 3.5–5.1)
SODIUM: 137 mmol/L (ref 135–145)
Total Bilirubin: 1.1 mg/dL (ref 0.3–1.2)
Total Protein: 7.6 g/dL (ref 6.5–8.1)

## 2016-03-24 LAB — ETHANOL: Alcohol, Ethyl (B): <5 mg/dL (ref <5)

## 2016-03-24 LAB — SALICYLATE LEVEL

## 2016-03-24 LAB — ACETAMINOPHEN LEVEL

## 2016-03-24 MED ORDER — AZITHROMYCIN 1 G PO PACK
1.0000 g | PACK | Freq: Once | ORAL | Status: AC
  Administered 2016-03-24: 1 g via ORAL
  Filled 2016-03-24: qty 1

## 2016-03-24 MED ORDER — OXYCODONE-ACETAMINOPHEN 5-325 MG PO TABS
2.0000 | ORAL_TABLET | Freq: Once | ORAL | Status: AC
  Administered 2016-03-24: 2 via ORAL
  Filled 2016-03-24: qty 2

## 2016-03-24 MED ORDER — CLONAZEPAM 0.5 MG PO TABS: 0.5000 mg | ORAL_TABLET | Freq: Three times a day (TID) | ORAL | Status: DC | PRN

## 2016-03-24 MED ORDER — CLONAZEPAM 0.5 MG PO TABS: 0.5000 mg | ORAL_TABLET | Freq: Once | ORAL | Status: DC

## 2016-03-24 MED ORDER — QUETIAPINE FUMARATE 25 MG PO TABS
50.0000 mg | ORAL_TABLET | Freq: Once | ORAL | Status: AC
  Administered 2016-03-24: 50 mg via ORAL
  Filled 2016-03-24: qty 2

## 2016-03-24 MED ORDER — CEFTRIAXONE SODIUM 250 MG IJ SOLR
250.0000 mg | Freq: Once | INTRAMUSCULAR | Status: AC
  Administered 2016-03-24: 250 mg via INTRAMUSCULAR
  Filled 2016-03-24: qty 250

## 2016-03-24 MED ORDER — CLONAZEPAM 0.5 MG PO TABS
0.5000 mg | ORAL_TABLET | Freq: Once | ORAL | Status: AC
  Administered 2016-03-24: 0.5 mg via ORAL
  Filled 2016-03-24: qty 1
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
## 2016-03-24 NOTE — BH Assessment (Signed)
Patient has been accepted [facility], Observation Unit.  Patient assigned to room Obs Bed 1 Accepting physician is Dr. Toni Amendlapacs on the behalf of Dr. Lucianne MussKumar.  Call report to [phone] or [phone]   Representative was Tora.  ER Staff is aware of it (Dr. Darnelle CatalanMalinda, ER MD & Nellie, Patient's Nurse)

## 2016-03-24 NOTE — ED Notes (Signed)
Urine collected and sent to lab. Pt given sprite, peanut butter and crackers.
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
## 2016-03-24 NOTE — ED Notes (Signed)
Pt given supper tray.

## 2016-03-25 ENCOUNTER — Observation Stay (HOSPITAL_COMMUNITY)
Admission: AD | Admit: 2016-03-25 | Discharge: 2016-03-25 | Disposition: A | Discharge status: Home / Self Care | Payer: Medicare Other | Source: Intra-hospital | Attending: Psychiatry | Admitting: Psychiatry

## 2016-03-25 ENCOUNTER — Encounter (HOSPITAL_COMMUNITY): Payer: Self-pay

## 2016-03-25 DIAGNOSIS — F602 Antisocial personality disorder: Secondary | ICD-10-CM | POA: Diagnosis not present

## 2016-03-25 DIAGNOSIS — F1210 Cannabis abuse, uncomplicated: Secondary | ICD-10-CM | POA: Insufficient documentation

## 2016-03-25 DIAGNOSIS — G8929 Other chronic pain: Secondary | ICD-10-CM | POA: Insufficient documentation

## 2016-03-25 DIAGNOSIS — F3181 Bipolar II disorder: Secondary | ICD-10-CM | POA: Diagnosis not present

## 2016-03-25 DIAGNOSIS — Z9889 Other specified postprocedural states: Secondary | ICD-10-CM | POA: Insufficient documentation

## 2016-03-25 DIAGNOSIS — F4310 Post-traumatic stress disorder, unspecified: Secondary | ICD-10-CM | POA: Insufficient documentation

## 2016-03-25 DIAGNOSIS — F603 Borderline personality disorder: Secondary | ICD-10-CM | POA: Diagnosis not present

## 2016-03-25 DIAGNOSIS — J449 Chronic obstructive pulmonary disease, unspecified: Secondary | ICD-10-CM | POA: Insufficient documentation

## 2016-03-25 DIAGNOSIS — G4700 Insomnia, unspecified: Secondary | ICD-10-CM | POA: Diagnosis not present

## 2016-03-25 DIAGNOSIS — F411 Generalized anxiety disorder: Secondary | ICD-10-CM | POA: Diagnosis not present

## 2016-03-25 DIAGNOSIS — F1414 Cocaine abuse with cocaine-induced mood disorder: Principal | ICD-10-CM | POA: Insufficient documentation

## 2016-03-25 DIAGNOSIS — F1994 Other psychoactive substance use, unspecified with psychoactive substance-induced mood disorder: Secondary | ICD-10-CM | POA: Diagnosis not present

## 2016-03-25 MED ORDER — ALBUTEROL SULFATE HFA 108 (90 BASE) MCG/ACT IN AERS
2.0000 | INHALATION_SPRAY | Freq: Four times a day (QID) | RESPIRATORY_TRACT | Status: DC | PRN
  Administered 2016-03-25: 2 via RESPIRATORY_TRACT
  Filled 2016-03-25: qty 6.7

## 2016-03-25 MED ORDER — CLONAZEPAM 0.5 MG PO TABS
0.5000 mg | ORAL_TABLET | Freq: Three times a day (TID) | ORAL | Status: DC
Start: 2016-03-25 — End: 2016-03-25
  Administered 2016-03-25 (×3): 0.5 mg via ORAL
  Filled 2016-03-25 (×3): qty 1

## 2016-03-25 MED ORDER — CLONAZEPAM 0.5 MG PO TABS: 0.5000 mg | ORAL_TABLET | Freq: Three times a day (TID) | ORAL | Status: DC

## 2016-03-25 MED ORDER — LAMOTRIGINE 25 MG PO TABS
50.0000 mg | ORAL_TABLET | Freq: Every day | ORAL | Status: DC
Start: 2016-03-26 — End: 2018-09-15

## 2016-03-25 MED ORDER — RISPERIDONE 2 MG PO TBDP
2.0000 mg | ORAL_TABLET | Freq: Three times a day (TID) | ORAL | Status: DC | PRN
  Administered 2016-03-25: 2 mg via ORAL
  Filled 2016-03-25: qty 1

## 2016-03-25 MED ORDER — ZIPRASIDONE MESYLATE 20 MG IM SOLR: 20.0000 mg | INTRAMUSCULAR | Status: DC | PRN

## 2016-03-25 MED ORDER — MAGNESIUM HYDROXIDE 400 MG/5ML PO SUSP: 30.0000 mL | Freq: Every day | ORAL | Status: DC | PRN

## 2016-03-25 MED ORDER — HYDROXYZINE HCL 25 MG PO TABS: 25.0000 mg | ORAL_TABLET | Freq: Four times a day (QID) | ORAL | Status: DC | PRN

## 2016-03-25 MED ORDER — ACETAMINOPHEN 325 MG PO TABS
650.0000 mg | ORAL_TABLET | Freq: Four times a day (QID) | ORAL | Status: DC | PRN
  Administered 2016-03-25 (×2): 650 mg via ORAL
  Filled 2016-03-25 (×2): qty 2

## 2016-03-25 MED ORDER — QUETIAPINE FUMARATE 200 MG PO TABS: 200.0000 mg | ORAL_TABLET | Freq: Every day | ORAL | Status: DC

## 2016-03-25 MED ORDER — LAMOTRIGINE 25 MG PO TABS: 50.0000 mg | ORAL_TABLET | Freq: Every day | ORAL | Status: DC

## 2016-03-25 MED ORDER — OXYCODONE-ACETAMINOPHEN 5-325 MG PO TABS
2.0000 | ORAL_TABLET | Freq: Once | ORAL | Status: AC
  Administered 2016-03-25: 2 via ORAL
  Filled 2016-03-25: qty 2

## 2016-03-25 MED ORDER — ALUM & MAG HYDROXIDE-SIMETH 200-200-20 MG/5ML PO SUSP: 30.0000 mL | ORAL | Status: DC | PRN

## 2016-03-25 MED ORDER — LORAZEPAM 1 MG PO TABS
1.0000 mg | ORAL_TABLET | ORAL | Status: AC | PRN
  Administered 2016-03-25: 1 mg via ORAL
  Filled 2016-03-25: qty 1

## 2016-03-25 MED ORDER — QUETIAPINE FUMARATE 200 MG PO TABS
200.0000 mg | ORAL_TABLET | Freq: Every day | ORAL | Status: DC
  Administered 2016-03-25: 200 mg via ORAL
  Filled 2016-03-25: qty 1

## 2016-03-25 NOTE — Progress Notes (Signed)
Nursing Shift Note:  Patient is mostly drowsy but is mostly quiet though appropriate when speaking. Complains of anxiety and "having too much on my mind". Denies any SI/HI/AVH and contracts for safety in that he agrees to alert staff should he develop any such thoughts. Nurse ensuring contstant observation of patient except when in bathroom and patient remains safe on Unit.
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
## 2016-03-25 NOTE — Progress Notes (Signed)
Nursing Discharge Note:  Patient has received all belongings and signed for them as well as signing the Discharge Summary and also given a copy of same. Patient continues to deny any SI/HI/AVH and states understanding of followup appointments and medications prescribed, has been given 3 prescriptions but giving them back to Nurse stating "I don't need that s___", "I came here for help and y'all ain't done a thing for me, I didn't even need to come here".  Patient continues to complain about his stay here but does agree to sign copy of the AVS for the chart and sign the Patient Belongings Sheet. [name] to procure patient a ride home to [place] for patient. [facility] by [employer] procured.

## 2016-03-25 NOTE — ED Notes (Signed)
Pt sent w/ [facility] along w/ belongings
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
## 2016-09-04 ENCOUNTER — Emergency Department
Admission: EM | Admit: 2016-09-04 | Discharge: 2016-09-04 | Disposition: A | Discharge status: Home / Self Care | Payer: Medicare Other | Attending: Emergency Medicine | Admitting: Emergency Medicine

## 2016-09-04 ENCOUNTER — Encounter: Payer: Self-pay | Admitting: Urgent Care

## 2016-09-04 DIAGNOSIS — Z87891 Personal history of nicotine dependence: Secondary | ICD-10-CM | POA: Diagnosis not present

## 2016-09-04 DIAGNOSIS — Z79899 Other long term (current) drug therapy: Secondary | ICD-10-CM | POA: Insufficient documentation

## 2016-09-04 DIAGNOSIS — F1920 Other psychoactive substance dependence, uncomplicated: Secondary | ICD-10-CM | POA: Diagnosis present

## 2016-09-04 DIAGNOSIS — J449 Chronic obstructive pulmonary disease, unspecified: Secondary | ICD-10-CM | POA: Insufficient documentation

## 2016-09-04 DIAGNOSIS — F1910 Other psychoactive substance abuse, uncomplicated: Secondary | ICD-10-CM

## 2016-09-04 LAB — COMPREHENSIVE METABOLIC PANEL
ALT: 25 U/L (ref 17–63)
AST: 21 U/L (ref 15–41)
Albumin: 3.8 g/dL (ref 3.5–5.0)
Alkaline Phosphatase: 54 U/L (ref 38–126)
Anion gap: 6 (ref 5–15)
BILIRUBIN TOTAL: 1.2 mg/dL (ref 0.3–1.2)
BUN: 15 mg/dL (ref 6–20)
CALCIUM: 8.8 mg/dL — AB (ref 8.9–10.3)
CHLORIDE: 108 mmol/L (ref 101–111)
CO2: 26 mmol/L (ref 22–32)
CREATININE: 1.14 mg/dL (ref 0.61–1.24)
Glucose, Bld: 88 mg/dL (ref 65–99)
POTASSIUM: 3.8 mmol/L (ref 3.5–5.1)
Sodium: 140 mmol/L (ref 135–145)
Total Protein: 6.5 g/dL (ref 6.5–8.1)

## 2016-09-04 LAB — ACETAMINOPHEN LEVEL: Acetaminophen (Tylenol), Serum: <10 ug/mL — ABNORMAL LOW (ref 10–30)

## 2016-09-04 LAB — ETHANOL: Alcohol, Ethyl (B): <5 mg/dL

## 2016-09-04 LAB — CBC WITH DIFFERENTIAL/PLATELET
Basophils Absolute: 0.1 10*3/uL (ref 0–0.1)
Basophils Relative: 1 %
Eosinophils Absolute: 0.3 10*3/uL (ref 0–0.7)
Eosinophils Relative: 3 %
HCT: 38.1 % — ABNORMAL LOW (ref 40.0–52.0)
Hemoglobin: 13.3 g/dL (ref 13.0–18.0)
Lymphocytes Relative: 27 %
Lymphs Abs: 2 10*3/uL (ref 1.0–3.6)
MCH: 29.9 pg (ref 26.0–34.0)
MCHC: 34.8 g/dL (ref 32.0–36.0)
MCV: 85.8 fL (ref 80.0–100.0)
Monocytes Absolute: 0.6 10*3/uL (ref 0.2–1.0)
Monocytes Relative: 8 %
Neutro Abs: 4.7 10*3/uL (ref 1.4–6.5)
Neutrophils Relative %: 61 %
Platelets: 253 10*3/uL (ref 150–440)
RBC: 4.44 MIL/uL (ref 4.40–5.90)
RDW: 13.8 % (ref 11.5–14.5)
WBC: 7.7 10*3/uL (ref 3.8–10.6)

## 2016-09-04 LAB — SALICYLATE LEVEL: Salicylate Lvl: <4 mg/dL (ref 2.8–30.0)

## 2016-09-04 MED ORDER — ALBUTEROL SULFATE (2.5 MG/3ML) 0.083% IN NEBU
2.5000 mg | INHALATION_SOLUTION | Freq: Once | RESPIRATORY_TRACT | Status: AC
  Administered 2016-09-04: 2.5 mg via RESPIRATORY_TRACT
  Filled 2016-09-04: qty 3

## 2016-09-04 MED ORDER — ALBUTEROL SULFATE (2.5 MG/3ML) 0.083% IN NEBU
INHALATION_SOLUTION | RESPIRATORY_TRACT | Status: AC
  Administered 2016-09-04: 2.5 mg via RESPIRATORY_TRACT
  Filled 2016-09-04: qty 3

## 2016-09-04 NOTE — ED Notes (Signed)
Patient asleep in room. No noted distress or abnormal behavior. Will continue 15 minute checks and observation by security cameras for safety. 

## 2016-09-04 NOTE — ED Notes (Signed)
Patient had SOC assessment. Patient resting quietly in room. No noted distress or abnormal behaviors noted. Will continue 15 minute checks and observation by security camera for safety. 

## 2016-09-04 NOTE — ED Notes (Signed)
Discharge disposition in progress.  Maintained on 15 minute checks and observation by security camera for safety.

## 2016-09-04 NOTE — ED Notes (Signed)
ED charge nurse called for bed; none available at this moment. Patient deemed safe; denies SI/HI at this time; VSS as charted on VSFS. Patient moved to ED subwait area to await bed assignment. When moving patient to subwait area, patient requesting food and coffee; states that he is homeless.

## 2016-09-04 NOTE — ED Triage Notes (Addendum)
Patient presents to the ED on a VOLUNTARY basis at this time. Patient reports that he wants detox from drugs; reports that he is a polysubstance abuser (marijuana, cocaine, and ETOH). Last used substances just PTA. Patient asking for his MDI and Klonopin to be refilled while here.

## 2016-09-04 NOTE — ED Notes (Signed)
Patient discharged to self. He was very angry stating "he had no where to go " although he told SW he would be going to local shelter. RN called [facility] for him but they do not take his insurance. RN educated patient about [facility] where he could go on Monday. Patient said we were not doing our job. He asked for a bus ticket; per SW local bus is not running today. He then asked us to call the police for a ride and was told that police would not drive a voluntary patient to a shelter. Patient cursed at RN and said "this hospital sucks."  Patient denies SI or HI. He received copy of discharge instructions and all personal belongings.

## 2016-09-04 NOTE — ED Notes (Signed)
Patient resting quietly in room. No noted distress or abnormal behaviors noted. Will continue 15 minute checks and observation by security camera for safety. 

## 2016-09-04 NOTE — ED Notes (Signed)
Pt request to be detox'd from cocaine and pot - pt last used cocaine and pot one hour ago - pt states he normally does not drink

## 2016-09-04 NOTE — ED Provider Notes (Signed)
[facility] Emergency Department Provider Note   ____________________________________________   First MD Initiated Contact with Patient 09/04/16 [phone]     (approximate)  I have reviewed the triage vital signs and the nursing notes.   HISTORY  Chief Complaint Drug Problem    HPI Reginald Hopkins is a 40 y.o. male who presents to the ED from the street requesting detox. Reports he uses cocaine, marijuana and alcohol. Last use substances prior to arrival. Denies SI/HI/AH/VH. Voices no medical complaints. Specifically, denies fever, chills, chest pain, shortness of breath, abdominal pain, nausea, vomiting, diarrhea. Denies recent travel or trauma. Patient is homeless.   Past Medical History:  Diagnosis Date  . Anxiety   . Back pain   . COPD (chronic obstructive pulmonary disease) (HCC)   . PTSD (post-traumatic stress disorder)     Patient Active Problem List   Diagnosis Date Noted  . Explosive personality disorder   . Bipolar 2 disorder, major depressive episode (HCC)   . GAD (generalized anxiety disorder)   . Substance induced mood disorder (HCC) 03/24/2016  . Cocaine abuse 03/24/2016  . PTSD (post-traumatic stress disorder) 03/24/2016  . COPD (chronic obstructive pulmonary disease) (HCC) 03/24/2016  . Antisocial personality disorder 03/24/2016    History reviewed. No pertinent surgical history.  Prior to Admission medications   Medication Sig Start Date End Date Taking? Authorizing Provider  albuterol (PROVENTIL HFA;VENTOLIN HFA) 108 (90 Base) MCG/ACT inhaler Inhale 2 puffs into the lungs 4 (four) times daily as needed for wheezing or shortness of breath.    Historical Provider, MD  clonazePAM (KLONOPIN) 0.5 MG tablet Take 1 tablet (0.5 mg total) by mouth 3 (three) times daily. 03/25/16   Beau FannyJohn C Withrow, FNP  hydrOXYzine (ATARAX/VISTARIL) 25 MG tablet Take 1 tablet (25 mg total) by mouth every 6 (six) hours as needed for anxiety. 03/25/16   Beau FannyJohn  C Withrow, FNP  lamoTRIgine (LAMICTAL) 25 MG tablet Take 2 tablets (50 mg total) by mouth daily. 03/26/16   Beau FannyJohn C Withrow, FNP  QUEtiapine (SEROQUEL) 200 MG tablet Take 1 tablet (200 mg total) by mouth at bedtime. 03/25/16   Beau FannyJohn C Withrow, FNP    Allergies Review of patient's allergies indicates no known allergies.  No family history on file.  Social History Social History  Substance Use Topics  . Smoking status: Former Games developermoker  . Smokeless tobacco: Never Used  . Alcohol use Yes    Review of Systems Constitutional: No fever/chills Eyes: No visual changes. ENT: No sore throat. Cardiovascular: Denies chest pain. Respiratory: Denies shortness of breath. Gastrointestinal: No abdominal pain.  No nausea, no vomiting.  No diarrhea.  No constipation. Genitourinary: Negative for dysuria. Musculoskeletal: Negative for back pain. Skin: Negative for rash. Neurological: Negative for headaches, focal weakness or numbness. Psychiatric:Positive for polysubstance abuse.  10-point ROS otherwise negative.  ____________________________________________   PHYSICAL EXAM:  VITAL SIGNS: ED Triage Vitals [09/04/16 0410]  Enc Vitals Group     BP (!) 125/91     Pulse Rate 73     Resp 16     Temp 98.6 F (37 C)     Temp Source Oral     SpO2 91 %     Weight 145 lb (65.8 kg)     Height 5\' 11"  (1.803 m)     Head Circumference      Peak Flow      Pain Score 10     Pain Loc      Pain Edu?  Excl. in GC?     Constitutional: Alert and oriented. Disheveled appearing and in no acute distress. Eyes: Conjunctivae are normal. PERRL. EOMI. Head: Atraumatic. Nose: No congestion/rhinnorhea. Mouth/Throat: Mucous membranes are moist.  Oropharynx non-erythematous. Neck: No stridor.   Cardiovascular: Normal rate, regular rhythm. Grossly normal heart sounds.  Good peripheral circulation. Respiratory: Normal respiratory effort.  No retractions. Lungs CTAB. Gastrointestinal: Soft and nontender. No  distention. No abdominal bruits. No CVA tenderness. Musculoskeletal: No lower extremity tenderness nor edema.  No joint effusions. Neurologic:  Normal speech and language. No gross focal neurologic deficits are appreciated. No gait instability. Skin:  Skin is warm, dry and intact. No rash noted. Psychiatric: Mood and affect are normal. Speech and behavior are normal.  ____________________________________________   LABS (all labs ordered are listed, but only abnormal results are displayed)  Labs Reviewed  CBC WITH DIFFERENTIAL/PLATELET - Abnormal; Notable for the following:       Result Value   HCT 38.1 (*)    All other components within normal limits  COMPREHENSIVE METABOLIC PANEL - Abnormal; Notable for the following:    Calcium 8.8 (*)    All other components within normal limits  ACETAMINOPHEN LEVEL - Abnormal; Notable for the following:    Acetaminophen (Tylenol), Serum <10 (*)    All other components within normal limits  ETHANOL  SALICYLATE LEVEL  URINE DRUG SCREEN, QUALITATIVE ([facility] ONLY)   ____________________________________________  EKG  None ____________________________________________  RADIOLOGY  None ____________________________________________   PROCEDURES  Procedure(s) performed: None  Procedures  Critical Care performed: No  ____________________________________________   INITIAL IMPRESSION / ASSESSMENT AND PLAN / ED COURSE  Pertinent labs & imaging results that were available during my care of the patient were reviewed by me and considered in my medical decision making (see chart for details).  40 year old male who is homeless who presents for polysubstance abuse, requiring detox. We'll obtain screening toxicological lab work and consult TTS to evaluate patient in the emergency department.  Clinical Course  Comment By Time  Patient is medically cleared for psychiatric disposition. Irean Hong, MD 09/23 [phone]      ____________________________________________   FINAL CLINICAL IMPRESSION(S) / ED DIAGNOSES  Final diagnoses:  Polysubstance abuse      NEW MEDICATIONS STARTED DURING THIS VISIT:  New Prescriptions   No medications on file     Note:  This document was prepared using Dragon voice recognition software and may include unintentional dictation errors.    Irean Hong, MD 09/04/16 [phone]

## 2016-09-04 NOTE — ED Provider Notes (Signed)
Patient's been cleared by psychiatry for discharge. Diagnosed with cocaine use disorder and referred to [facility]   Reginald FilbertJonathan E Williams, MD 09/04/16 1547

## 2016-09-04 NOTE — ED Notes (Signed)
Report was received from Harrold Donatheresa H., RN; Pt. Verbalizes no complaints or distress; denies S.I./Hi.; requesting detox from cocaine and cannibi; Continue to monitor with 15 min.

## 2016-09-04 NOTE — ED Notes (Signed)

## 2016-09-04 NOTE — ED Notes (Signed)
Patient complaining of shortness of breath, very agitated requesting inhaler. Pulse ox 91%. Case discussed with ED MD; ordered nebulizer treatment.  Patient cooperative with treatment. He reports greater ease in breathing.  Patient also requesting Percocet and Klonopin. Patient was told that MD would review all his medications. Patient states he is here for help with "addiction." He states he used cocaine yesterday for the first time in a long time. Concurrently smoking marijuana. No other street drugs.  Maintained on 15 minute checks and observation by security camera for safety.

## 2016-09-04 NOTE — ED Notes (Signed)
Pt refuses to give urine sample - he states he will give it when he wakes up and not before

## 2016-09-04 NOTE — ED Notes (Signed)

## 2016-09-04 NOTE — Progress Notes (Signed)
LCSW [facility] with [facility] nurse and will provide patient with additional community resource list. In discussion with patient he reports [facility] will [facility] and [facility]. Patient stated he is not homicidal or suicidal.  Arrie SenateClaudine Rasheen Schewe LCSW [phone]

## 2016-09-04 NOTE — ED Notes (Signed)
Patient received lunch tray. Patient currently having a tele psych session.

## 2016-10-25 ENCOUNTER — Emergency Department
Admission: EM | Admit: 2016-10-25 | Discharge: 2016-10-25 | Disposition: A | Discharge status: Home / Self Care | Payer: Medicare Other | Attending: Emergency Medicine | Admitting: Emergency Medicine

## 2016-10-25 ENCOUNTER — Encounter: Payer: Self-pay | Admitting: Emergency Medicine

## 2016-10-25 DIAGNOSIS — Z79899 Other long term (current) drug therapy: Secondary | ICD-10-CM | POA: Insufficient documentation

## 2016-10-25 DIAGNOSIS — Z87891 Personal history of nicotine dependence: Secondary | ICD-10-CM | POA: Diagnosis not present

## 2016-10-25 DIAGNOSIS — F1490 Cocaine use, unspecified, uncomplicated: Secondary | ICD-10-CM | POA: Diagnosis not present

## 2016-10-25 DIAGNOSIS — Z0000 Encounter for general adult medical examination without abnormal findings: Secondary | ICD-10-CM | POA: Diagnosis present

## 2016-10-25 DIAGNOSIS — F1290 Cannabis use, unspecified, uncomplicated: Secondary | ICD-10-CM | POA: Insufficient documentation

## 2016-10-25 DIAGNOSIS — J449 Chronic obstructive pulmonary disease, unspecified: Secondary | ICD-10-CM | POA: Insufficient documentation

## 2016-10-25 DIAGNOSIS — F1910 Other psychoactive substance abuse, uncomplicated: Secondary | ICD-10-CM | POA: Insufficient documentation

## 2016-10-25 LAB — COMPREHENSIVE METABOLIC PANEL
ALBUMIN: 4.4 g/dL (ref 3.5–5.0)
ALK PHOS: 62 U/L (ref 38–126)
ALT: 45 U/L (ref 17–63)
ANION GAP: 8 (ref 5–15)
AST: 40 U/L (ref 15–41)
BILIRUBIN TOTAL: 1 mg/dL (ref 0.3–1.2)
BUN: 23 mg/dL — AB (ref 6–20)
CALCIUM: 9.3 mg/dL (ref 8.9–10.3)
CO2: 29 mmol/L (ref 22–32)
Chloride: 101 mmol/L (ref 101–111)
Creatinine, Ser: 1.34 mg/dL — ABNORMAL HIGH (ref 0.61–1.24)
GFR calc Af Amer: >60 mL/min (ref >60)
GLUCOSE: 187 mg/dL — AB (ref 65–99)
POTASSIUM: 3.5 mmol/L (ref 3.5–5.1)
Sodium: 138 mmol/L (ref 135–145)
TOTAL PROTEIN: 7.7 g/dL (ref 6.5–8.1)

## 2016-10-25 LAB — CBC
HCT: 45.6 % (ref 40.0–52.0)
Hemoglobin: 15.3 g/dL (ref 13.0–18.0)
MCH: 28.4 pg (ref 26.0–34.0)
MCHC: 33.6 g/dL (ref 32.0–36.0)
MCV: 84.4 fL (ref 80.0–100.0)
PLATELETS: 303 10*3/uL (ref 150–440)
RBC: 5.4 MIL/uL (ref 4.40–5.90)
RDW: 14.6 % — ABNORMAL HIGH (ref 11.5–14.5)
WBC: 6 10*3/uL (ref 3.8–10.6)

## 2016-10-25 LAB — URINE DRUG SCREEN, QUALITATIVE (ARMC ONLY)
AMPHETAMINES, UR SCREEN: NOT DETECTED
Barbiturates, Ur Screen: NOT DETECTED
Benzodiazepine, Ur Scrn: POSITIVE — AB
CANNABINOID 50 NG, UR ~~LOC~~: NOT DETECTED
Cocaine Metabolite,Ur ~~LOC~~: POSITIVE — AB
MDMA (ECSTASY) UR SCREEN: NOT DETECTED
METHADONE SCREEN, URINE: NOT DETECTED
Opiate, Ur Screen: NOT DETECTED
Phencyclidine (PCP) Ur S: NOT DETECTED
TRICYCLIC, UR SCREEN: NOT DETECTED

## 2016-10-25 LAB — ETHANOL: Alcohol, Ethyl (B): <5 mg/dL (ref <5)

## 2016-10-25 MED ORDER — ACETAMINOPHEN 500 MG PO TABS
1000.0000 mg | ORAL_TABLET | Freq: Once | ORAL | Status: AC
  Administered 2016-10-25: 1000 mg via ORAL
  Filled 2016-10-25: qty 2

## 2016-10-25 NOTE — ED Provider Notes (Signed)
[facility] Emergency Department Provider Note  Time seen: 5:20 PM  I have reviewed the triage vital signs and the nursing notes.   HISTORY  Chief Complaint Medical Clearance    HPI Reginald Hopkins is a 40 y.o. male with a past medical history of anxiety, COPD, substance abuse who presents the emergency department hoping to detox from cocaine and alcohol. According to the patient he left his group home one week ago, for the past one week he has been drinking excessively and using a lot of cocaine. Patient states he called his caseworker who brought him here hoping to get him into a detox facility. The patient wishes to go to a detox facility as well. Patient denies any SI/HI. Patient states he has not been taking any of his prescribed medications including Seroquel, clonazepam, and pro-air.  Past Medical History:  Diagnosis Date  . Anxiety   . Back pain   . COPD (chronic obstructive pulmonary disease) (HCC)   . PTSD (post-traumatic stress disorder)     Patient Active Problem List   Diagnosis Date Noted  . Explosive personality disorder   . Bipolar 2 disorder, major depressive episode (HCC)   . GAD (generalized anxiety disorder)   . Substance induced mood disorder (HCC) 03/24/2016  . Cocaine abuse 03/24/2016  . PTSD (post-traumatic stress disorder) 03/24/2016  . COPD (chronic obstructive pulmonary disease) (HCC) 03/24/2016  . Antisocial personality disorder 03/24/2016    History reviewed. No pertinent surgical history.  Prior to Admission medications   Medication Sig Start Date End Date Taking? Authorizing Provider  albuterol (PROVENTIL HFA;VENTOLIN HFA) 108 (90 Base) MCG/ACT inhaler Inhale 2 puffs into the lungs 4 (four) times daily as needed for wheezing or shortness of breath.    Historical Provider, MD  clonazePAM (KLONOPIN) 0.5 MG tablet Take 1 tablet (0.5 mg total) by mouth 3 (three) times daily. 03/25/16   Beau FannyJohn C Withrow, FNP  hydrOXYzine  (ATARAX/VISTARIL) 25 MG tablet Take 1 tablet (25 mg total) by mouth every 6 (six) hours as needed for anxiety. 03/25/16   Beau FannyJohn C Withrow, FNP  lamoTRIgine (LAMICTAL) 25 MG tablet Take 2 tablets (50 mg total) by mouth daily. 03/26/16   Beau FannyJohn C Withrow, FNP  QUEtiapine (SEROQUEL) 200 MG tablet Take 1 tablet (200 mg total) by mouth at bedtime. 03/25/16   Beau FannyJohn C Withrow, FNP    No Known Allergies  No family history on file.  Social History Social History  Substance Use Topics  . Smoking status: Former Games developermoker  . Smokeless tobacco: Never Used  . Alcohol use Yes    Review of Systems Constitutional: Negative for fever. Cardiovascular: Negative for chest pain. Respiratory: Negative for shortness of breath. Gastrointestinal: Negative for abdominal pain Neurological: Negative for headache 10-point ROS otherwise negative.  ____________________________________________   PHYSICAL EXAM:  VITAL SIGNS: ED Triage Vitals  Enc Vitals Group     BP 10/25/16 1557 130/71     Pulse Rate 10/25/16 1557 78     Resp 10/25/16 1557 20     Temp 10/25/16 1557 98.1 F (36.7 C)     Temp Source 10/25/16 1557 Oral     SpO2 10/25/16 1557 99 %     Weight 10/25/16 1559 140 lb (63.5 kg)     Height 10/25/16 1559 5\' 11"  (1.803 m)     Head Circumference --      Peak Flow --      Pain Score 10/25/16 1600 9     Pain  Loc --      Pain Edu? --      Excl. in GC? --     Constitutional: Alert and oriented. Well appearing and in no distress. Eyes: Normal exam ENT   Head: Normocephalic and atraumatic   Mouth/Throat: Mucous membranes are moist. Cardiovascular: Normal rate, regular rhythm. No murmur Respiratory: Normal respiratory effort without tachypnea nor retractions. Breath sounds are clear  Gastrointestinal: Soft and nontender. No distention.  Musculoskeletal: Nontender with normal range of motion in all extremities.  Neurologic:  Normal speech and language. No gross focal neurologic deficits  Skin:   Skin is warm, dry and intact.  Psychiatric: Mood and affect are normal.   ____________________________________________    INITIAL IMPRESSION / ASSESSMENT AND PLAN / ED COURSE  Pertinent labs & imaging results that were available during my care of the patient were reviewed by me and considered in my medical decision making (see chart for details).  Patient presents to the emergency department hoping to detox from cocaine and alcohol. Patient has a positive cocaine and benzodiazepine urine toxicology screen. Alcohol levels negative. We will discuss with TTS if there is any availability to place the patient into a detox program. Patient does not currently have any medical complaints.  TTS is seen the patient. The patient has Medicaid, they were unable to place the patient. They provided the patient with a list of resources for outpatient detox. Patient will follow-up with these resources.  ____________________________________________   FINAL CLINICAL IMPRESSION(S) / ED DIAGNOSES  Cocaine abuse Alcohol abuse    Minna AntisKevin Letasha Kershaw, MD 10/25/16 2144

## 2016-10-25 NOTE — ED Notes (Addendum)
Pt states he is here for detox off ETOH and cocaine. Pt case worker was here with pt but has gone home. Pt states last ETOH use yesterday and cocaine two days ago. Pt states blurred vision but denies CP, SOB, N&V, diarrhea, fever. Pt states he has COPD. Pt states he does not know when the last time he took his medicine was and he states he needs his medications. Pt denies SI or HI, states occassional HI but not currently.

## 2016-10-25 NOTE — Progress Notes (Signed)
TTS provided a list of 3 substance abuse treatment facilities in [place] that provides substance abuse treatment to patients with Medicare.  The facilities are [facility], [facility], and [facility].

## 2016-10-25 NOTE — ED Triage Notes (Signed)
Has been gone from group home x 1 week. States has been using cocaine and ETOH. Here with caseworker. Patient states has not been taking usual meds. Denies thoughts of harming self.

## 2016-10-25 NOTE — Discharge Instructions (Signed)
Please follow-up with the outpatient resources provided to you in the emergency department tonight. Return to the emergency department for any personally concerning symptoms/emergent conditions.

## 2016-10-25 NOTE — ED Notes (Signed)
Pt asked Sarah, EDT what plan was, getting angry. Maralyn SagoSarah, EDT told this RN who informed Dr. Lenard LancePaduchowski who said pt can be D/C. Dr. Lenard LancePaduchowski at bedside to inform pt.

## 2016-10-25 NOTE — ED Notes (Signed)
Pt cursing at this Rn upon DC, refused to sign for DC. Pt ambulated toward lobby in his own clothes.

## 2016-10-25 NOTE — ED Notes (Signed)
Case worker Doreatha MartinSam [phone]

## 2016-10-25 NOTE — ED Notes (Signed)
Pt asleep on R side, warm blanket covering pt.

## 2016-10-25 NOTE — ED Notes (Signed)
Pt eating meal tray, sitting up on stretcher.

## 2016-10-25 NOTE — ED Notes (Signed)
Pt given 1 of 1 bag of belongings back. Ambulatory to restroom to change.

## 2016-12-07 ENCOUNTER — Emergency Department
Admission: EM | Admit: 2016-12-07 | Discharge: 2016-12-07 | Discharge status: Against Medical Advice | Payer: Medicare Other

## 2017-11-28 ENCOUNTER — Emergency Department
Admission: EM | Admit: 2017-11-28 | Discharge: 2017-11-28 | Disposition: A | Discharge status: Home / Self Care | Payer: Medicare Other | Attending: Emergency Medicine | Admitting: Emergency Medicine

## 2017-11-28 DIAGNOSIS — F419 Anxiety disorder, unspecified: Secondary | ICD-10-CM | POA: Diagnosis present

## 2017-11-28 DIAGNOSIS — Z79899 Other long term (current) drug therapy: Secondary | ICD-10-CM | POA: Diagnosis not present

## 2017-11-28 DIAGNOSIS — F339 Major depressive disorder, recurrent, unspecified: Secondary | ICD-10-CM | POA: Insufficient documentation

## 2017-11-28 DIAGNOSIS — F1994 Other psychoactive substance use, unspecified with psychoactive substance-induced mood disorder: Secondary | ICD-10-CM | POA: Diagnosis present

## 2017-11-28 DIAGNOSIS — Z87891 Personal history of nicotine dependence: Secondary | ICD-10-CM | POA: Diagnosis not present

## 2017-11-28 DIAGNOSIS — F4325 Adjustment disorder with mixed disturbance of emotions and conduct: Secondary | ICD-10-CM | POA: Diagnosis not present

## 2017-11-28 DIAGNOSIS — F6381 Intermittent explosive disorder: Secondary | ICD-10-CM | POA: Diagnosis not present

## 2017-11-28 DIAGNOSIS — F1410 Cocaine abuse, uncomplicated: Secondary | ICD-10-CM | POA: Diagnosis present

## 2017-11-28 DIAGNOSIS — F3181 Bipolar II disorder: Secondary | ICD-10-CM | POA: Diagnosis not present

## 2017-11-28 DIAGNOSIS — J449 Chronic obstructive pulmonary disease, unspecified: Secondary | ICD-10-CM | POA: Diagnosis present

## 2017-11-28 LAB — COMPREHENSIVE METABOLIC PANEL
ALT: 56 U/L (ref 17–63)
ANION GAP: 10 (ref 5–15)
AST: 45 U/L — ABNORMAL HIGH (ref 15–41)
Albumin: 4.4 g/dL (ref 3.5–5.0)
Alkaline Phosphatase: 56 U/L (ref 38–126)
BUN: 35 mg/dL — ABNORMAL HIGH (ref 6–20)
CHLORIDE: 103 mmol/L (ref 101–111)
CO2: 23 mmol/L (ref 22–32)
Calcium: 9.7 mg/dL (ref 8.9–10.3)
Creatinine, Ser: 1.27 mg/dL — ABNORMAL HIGH (ref 0.61–1.24)
Glucose, Bld: 85 mg/dL (ref 65–99)
Potassium: 3.9 mmol/L (ref 3.5–5.1)
SODIUM: 136 mmol/L (ref 135–145)
Total Bilirubin: 2 mg/dL — ABNORMAL HIGH (ref 0.3–1.2)
Total Protein: 7.5 g/dL (ref 6.5–8.1)

## 2017-11-28 LAB — CBC WITH DIFFERENTIAL/PLATELET
BASOS ABS: 0 10*3/uL (ref 0–0.1)
BASOS PCT: 1 %
EOS PCT: 0 %
Eosinophils Absolute: 0 10*3/uL (ref 0–0.7)
HEMATOCRIT: 45.1 % (ref 40.0–52.0)
Hemoglobin: 15.2 g/dL (ref 13.0–18.0)
LYMPHS PCT: 22 %
Lymphs Abs: 1.9 10*3/uL (ref 1.0–3.6)
MCH: 29.9 pg (ref 26.0–34.0)
MCHC: 33.6 g/dL (ref 32.0–36.0)
MCV: 88.8 fL (ref 80.0–100.0)
MONO ABS: 1 10*3/uL (ref 0.2–1.0)
Monocytes Relative: 11 %
NEUTROS ABS: 5.8 10*3/uL (ref 1.4–6.5)
Neutrophils Relative %: 66 %
PLATELETS: 265 10*3/uL (ref 150–440)
RBC: 5.08 MIL/uL (ref 4.40–5.90)
RDW: 14.1 % (ref 11.5–14.5)
WBC: 8.8 10*3/uL (ref 3.8–10.6)

## 2017-11-28 LAB — SALICYLATE LEVEL

## 2017-11-28 LAB — URINE DRUG SCREEN, QUALITATIVE (ARMC ONLY)
AMPHETAMINES, UR SCREEN: NOT DETECTED
BENZODIAZEPINE, UR SCRN: POSITIVE — AB
Barbiturates, Ur Screen: NOT DETECTED
Cannabinoid 50 Ng, Ur ~~LOC~~: NOT DETECTED
Cocaine Metabolite,Ur ~~LOC~~: POSITIVE — AB
MDMA (ECSTASY) UR SCREEN: NOT DETECTED
METHADONE SCREEN, URINE: NOT DETECTED
Opiate, Ur Screen: NOT DETECTED
PHENCYCLIDINE (PCP) UR S: NOT DETECTED
Tricyclic, Ur Screen: NOT DETECTED

## 2017-11-28 LAB — ACETAMINOPHEN LEVEL

## 2017-11-28 LAB — ETHANOL

## 2017-11-28 MED ORDER — IPRATROPIUM-ALBUTEROL 0.5-2.5 (3) MG/3ML IN SOLN
RESPIRATORY_TRACT | Status: AC
  Administered 2017-11-28: 3 mL via RESPIRATORY_TRACT
  Filled 2017-11-28: qty 3

## 2017-11-28 MED ORDER — IPRATROPIUM-ALBUTEROL 0.5-2.5 (3) MG/3ML IN SOLN
3.0000 mL | Freq: Once | RESPIRATORY_TRACT | Status: AC
  Administered 2017-11-28: 3 mL via RESPIRATORY_TRACT

## 2017-11-28 MED ORDER — LORAZEPAM 2 MG PO TABS
2.0000 mg | ORAL_TABLET | Freq: Once | ORAL | Status: AC
  Administered 2017-11-28: 2 mg via ORAL
  Filled 2017-11-28: qty 1

## 2017-11-28 NOTE — ED Notes (Signed)
Pt c/o sob, wheezing. Asking for inhaler. Dr Lenard Lancepaduchowski notified. duoneb ordered.
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
## 2017-11-28 NOTE — ED Notes (Signed)
Pt very anxious at this time. Pt pacing in room. Will wait for protocols after dress out.
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
## 2017-11-28 NOTE — ED Provider Notes (Signed)
-----------------------------------------   3:24 PM on 11/28/2017 -----------------------------------------  Patient has been seen and evaluated by psychiatry, they believe the patient is safe for discharge home from a psychiatric standpoint.  Patient's medical workup has been largely nonrevealing we will discharge at this time.   Minna AntisPaduchowski, Aubreigh Fuerte, MD 11/28/17 1524

## 2017-11-28 NOTE — ED Notes (Signed)
Pt agitated at discharge because he was given a bus pass instead of taxi voucher. asked if he was getting an inhaler and when I told him I could ask the EDP for a prescription pt said just forget it and that this hospital was the worst. I asked again about prescription and pt said no and left room heading to lobby.

## 2017-11-28 NOTE — Discharge Instructions (Signed)
You have been seen in the emergency department for a  psychiatric concern. You have been evaluated both medically as well as psychiatrically. Please follow-up with your outpatient resources provided. Return to the emergency department for any worsening symptoms, or any thoughts of hurting yourself or anyone else so that we may attempt to help you. 

## 2017-11-28 NOTE — ED Notes (Signed)
Pt sitting in lobby in no acute distress.  
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
## 2018-02-10 ENCOUNTER — Emergency Department: Payer: Medicare Other

## 2018-02-10 ENCOUNTER — Inpatient Hospital Stay
Admission: EM | Admit: 2018-02-10 | Discharge: 2018-02-10 | DRG: 190 | Disposition: A | Discharge status: Home / Self Care | Payer: Medicare Other | Attending: Internal Medicine | Admitting: Internal Medicine

## 2018-02-10 DIAGNOSIS — R0602 Shortness of breath: Secondary | ICD-10-CM

## 2018-02-10 DIAGNOSIS — M549 Dorsalgia, unspecified: Secondary | ICD-10-CM | POA: Diagnosis present

## 2018-02-10 DIAGNOSIS — F1410 Cocaine abuse, uncomplicated: Secondary | ICD-10-CM | POA: Diagnosis present

## 2018-02-10 DIAGNOSIS — Z87891 Personal history of nicotine dependence: Secondary | ICD-10-CM | POA: Diagnosis not present

## 2018-02-10 DIAGNOSIS — J9602 Acute respiratory failure with hypercapnia: Secondary | ICD-10-CM | POA: Diagnosis present

## 2018-02-10 DIAGNOSIS — F4310 Post-traumatic stress disorder, unspecified: Secondary | ICD-10-CM | POA: Diagnosis not present

## 2018-02-10 DIAGNOSIS — J9601 Acute respiratory failure with hypoxia: Secondary | ICD-10-CM | POA: Diagnosis not present

## 2018-02-10 DIAGNOSIS — F419 Anxiety disorder, unspecified: Secondary | ICD-10-CM | POA: Diagnosis not present

## 2018-02-10 DIAGNOSIS — Z79899 Other long term (current) drug therapy: Secondary | ICD-10-CM | POA: Diagnosis not present

## 2018-02-10 DIAGNOSIS — J441 Chronic obstructive pulmonary disease with (acute) exacerbation: Principal | ICD-10-CM | POA: Diagnosis present

## 2018-02-10 DIAGNOSIS — G8929 Other chronic pain: Secondary | ICD-10-CM | POA: Diagnosis not present

## 2018-02-10 DIAGNOSIS — R0902 Hypoxemia: Secondary | ICD-10-CM

## 2018-02-10 LAB — COMPREHENSIVE METABOLIC PANEL
ALK PHOS: 55 U/L (ref 38–126)
ALT: 30 U/L (ref 17–63)
ANION GAP: 6 (ref 5–15)
AST: 25 U/L (ref 15–41)
Albumin: 4.7 g/dL (ref 3.5–5.0)
BILIRUBIN TOTAL: 0.9 mg/dL (ref 0.3–1.2)
BUN: 22 mg/dL — ABNORMAL HIGH (ref 6–20)
CALCIUM: 9.4 mg/dL (ref 8.9–10.3)
CO2: 31 mmol/L (ref 22–32)
CREATININE: 1.25 mg/dL — AB (ref 0.61–1.24)
Chloride: 105 mmol/L (ref 101–111)
GFR calc non Af Amer: >60 mL/min (ref >60)
Glucose, Bld: 112 mg/dL — ABNORMAL HIGH (ref 65–99)
Potassium: 3.9 mmol/L (ref 3.5–5.1)
Sodium: 142 mmol/L (ref 135–145)
TOTAL PROTEIN: 7.6 g/dL (ref 6.5–8.1)

## 2018-02-10 LAB — CBC WITH DIFFERENTIAL/PLATELET
Basophils Absolute: 0.1 10*3/uL (ref 0–0.1)
Basophils Relative: 1 %
Eosinophils Absolute: 0.2 10*3/uL (ref 0–0.7)
Eosinophils Relative: 3 %
HEMATOCRIT: 47.3 % (ref 40.0–52.0)
HEMOGLOBIN: 16.1 g/dL (ref 13.0–18.0)
LYMPHS PCT: 33 %
Lymphs Abs: 2.5 10*3/uL (ref 1.0–3.6)
MCH: 30 pg (ref 26.0–34.0)
MCHC: 34.1 g/dL (ref 32.0–36.0)
MCV: 88 fL (ref 80.0–100.0)
MONOS PCT: 9 %
Monocytes Absolute: 0.7 10*3/uL (ref 0.2–1.0)
NEUTROS ABS: 4 10*3/uL (ref 1.4–6.5)
NEUTROS PCT: 54 %
Platelets: 230 10*3/uL (ref 150–440)
RBC: 5.37 MIL/uL (ref 4.40–5.90)
RDW: 13.1 % (ref 11.5–14.5)
WBC: 7.5 10*3/uL (ref 3.8–10.6)

## 2018-02-10 LAB — URINE DRUG SCREEN, QUALITATIVE (ARMC ONLY)
Amphetamines, Ur Screen: NOT DETECTED
BARBITURATES, UR SCREEN: NOT DETECTED
Benzodiazepine, Ur Scrn: NOT DETECTED
COCAINE METABOLITE, UR ~~LOC~~: POSITIVE — AB
Cannabinoid 50 Ng, Ur ~~LOC~~: NOT DETECTED
MDMA (ECSTASY) UR SCREEN: NOT DETECTED
Methadone Scn, Ur: NOT DETECTED
Opiate, Ur Screen: NOT DETECTED
PHENCYCLIDINE (PCP) UR S: NOT DETECTED
TRICYCLIC, UR SCREEN: NOT DETECTED

## 2018-02-10 LAB — TROPONIN I

## 2018-02-10 LAB — INFLUENZA PANEL BY PCR (TYPE A & B)
INFLBPCR: NEGATIVE
Influenza A By PCR: NEGATIVE

## 2018-02-10 LAB — ETHANOL: Alcohol, Ethyl (B): <10 mg/dL (ref <10)

## 2018-02-10 MED ORDER — CLONAZEPAM 0.5 MG PO TABS: 0.5000 mg | ORAL_TABLET | Freq: Three times a day (TID) | ORAL | Status: DC | PRN

## 2018-02-10 MED ORDER — ENOXAPARIN SODIUM 40 MG/0.4ML ~~LOC~~ SOLN
40.0000 mg | SUBCUTANEOUS | Status: DC
  Filled 2018-02-10: qty 0.4

## 2018-02-10 MED ORDER — METHYLPREDNISOLONE SODIUM SUCC 125 MG IJ SOLR
125.0000 mg | Freq: Two times a day (BID) | INTRAMUSCULAR | Status: DC
  Filled 2018-02-10: qty 2

## 2018-02-10 MED ORDER — IPRATROPIUM-ALBUTEROL 0.5-2.5 (3) MG/3ML IN SOLN
3.0000 mL | Freq: Four times a day (QID) | RESPIRATORY_TRACT | Status: DC
  Administered 2018-02-10 (×2): 3 mL via RESPIRATORY_TRACT
  Filled 2018-02-10 (×2): qty 3

## 2018-02-10 MED ORDER — IBUPROFEN 800 MG PO TABS
ORAL_TABLET | ORAL | Status: AC
  Administered 2018-02-10: 800 mg via ORAL
  Filled 2018-02-10: qty 1

## 2018-02-10 MED ORDER — PREDNISONE 20 MG PO TABS: 40.0000 mg | ORAL_TABLET | Freq: Every day | ORAL | 0 refills | Status: DC

## 2018-02-10 MED ORDER — ACETAMINOPHEN 650 MG RE SUPP: 650.0000 mg | Freq: Four times a day (QID) | RECTAL | Status: DC | PRN

## 2018-02-10 MED ORDER — NEBULIZER DEVI: 0 refills | Status: DC

## 2018-02-10 MED ORDER — ACETAMINOPHEN-CODEINE #3 300-30 MG PO TABS: 1.0000 | ORAL_TABLET | Freq: Four times a day (QID) | ORAL | Status: DC | PRN

## 2018-02-10 MED ORDER — PREGABALIN 50 MG PO CAPS
ORAL_CAPSULE | ORAL | Status: AC
  Administered 2018-02-10: 100 mg via ORAL
  Filled 2018-02-10: qty 2

## 2018-02-10 MED ORDER — IPRATROPIUM-ALBUTEROL 0.5-2.5 (3) MG/3ML IN SOLN: 3.0000 mL | Freq: Four times a day (QID) | RESPIRATORY_TRACT | 0 refills | Status: DC | PRN

## 2018-02-10 MED ORDER — METHYLPREDNISOLONE SODIUM SUCC 125 MG IJ SOLR
125.0000 mg | Freq: Once | INTRAMUSCULAR | Status: AC
  Administered 2018-02-10: 125 mg via INTRAVENOUS
  Filled 2018-02-10: qty 2

## 2018-02-10 MED ORDER — QUETIAPINE FUMARATE 200 MG PO TABS
200.0000 mg | ORAL_TABLET | Freq: Every day | ORAL | Status: DC
  Filled 2018-02-10: qty 1

## 2018-02-10 MED ORDER — SODIUM CHLORIDE 0.9 % IV BOLUS (SEPSIS)
2000.0000 mL | Freq: Once | INTRAVENOUS | Status: AC
  Administered 2018-02-10: 2000 mL via INTRAVENOUS

## 2018-02-10 MED ORDER — GUAIFENESIN ER 600 MG PO TB12
600.0000 mg | ORAL_TABLET | Freq: Two times a day (BID) | ORAL | Status: DC
  Filled 2018-02-10: qty 1

## 2018-02-10 MED ORDER — ONDANSETRON HCL 4 MG/2ML IJ SOLN: 4.0000 mg | Freq: Four times a day (QID) | INTRAMUSCULAR | Status: DC | PRN

## 2018-02-10 MED ORDER — LORAZEPAM 2 MG/ML IJ SOLN
2.0000 mg | Freq: Once | INTRAMUSCULAR | Status: AC
  Administered 2018-02-10: 2 mg via INTRAVENOUS

## 2018-02-10 MED ORDER — PREGABALIN 50 MG PO CAPS
100.0000 mg | ORAL_CAPSULE | Freq: Two times a day (BID) | ORAL | Status: DC
  Administered 2018-02-10: 100 mg via ORAL
  Filled 2018-02-10: qty 2

## 2018-02-10 MED ORDER — POLYETHYLENE GLYCOL 3350 17 G PO PACK
17.0000 g | PACK | Freq: Every day | ORAL | Status: DC | PRN
Start: 2018-02-10 — End: 2018-02-10

## 2018-02-10 MED ORDER — IBUPROFEN 800 MG PO TABS
800.0000 mg | ORAL_TABLET | Freq: Three times a day (TID) | ORAL | Status: DC
  Administered 2018-02-10: 800 mg via ORAL
  Filled 2018-02-10 (×2): qty 1
  Filled 2018-02-10: qty 2

## 2018-02-10 MED ORDER — ONDANSETRON HCL 4 MG PO TABS: 4.0000 mg | ORAL_TABLET | Freq: Four times a day (QID) | ORAL | Status: DC | PRN

## 2018-02-10 MED ORDER — IPRATROPIUM-ALBUTEROL 0.5-2.5 (3) MG/3ML IN SOLN
3.0000 mL | Freq: Once | RESPIRATORY_TRACT | Status: AC
  Administered 2018-02-10: 3 mL via RESPIRATORY_TRACT
  Filled 2018-02-10: qty 3

## 2018-02-10 MED ORDER — SODIUM CHLORIDE 0.9% FLUSH: 3.0000 mL | Freq: Two times a day (BID) | INTRAVENOUS | Status: DC

## 2018-02-10 MED ORDER — ACETAMINOPHEN 325 MG PO TABS: 650.0000 mg | ORAL_TABLET | Freq: Four times a day (QID) | ORAL | Status: DC | PRN

## 2018-02-10 MED ORDER — ALBUTEROL SULFATE (2.5 MG/3ML) 0.083% IN NEBU: 2.5000 mg | INHALATION_SOLUTION | RESPIRATORY_TRACT | Status: DC | PRN

## 2018-02-10 MED ORDER — GUAIFENESIN-DM 100-10 MG/5ML PO SYRP: 5.0000 mL | ORAL_SOLUTION | ORAL | 0 refills | Status: DC | PRN

## 2018-02-10 NOTE — ED Provider Notes (Signed)
[facility] Emergency Department Provider Note   ____________________________________________   First MD Initiated Contact with Patient 02/10/18 [phone]     (approximate)  I have reviewed the triage vital signs and the nursing notes.   HISTORY  Chief Complaint Shortness of Breath    HPI Reginald Hopkins is a 42 y.o. male brought to the ED from home via EMS with a chief complaint of respiratory distress.  Patient has a history of anxiety, COPD, cocaine use who presents with progressive shortness of breath this morning.  Admits to using $40 worth of cocaine tonight.  EMS reports room air saturations in the mid 80s%.  Arrives to the emergency department on CPAP.  Received 2 DuoNeb's and nitroglycerin per EMS prior to arrival.  Arrives combative, agitated and pulling off his CPAP mask.  Denies recent fever, chills, abdominal pain, nausea, vomiting.  Complains of chest tightness with respiratory distress.  Denies recent travel or trauma.   Past Medical History:  Diagnosis Date  . Anxiety   . Back pain   . COPD (chronic obstructive pulmonary disease) (HCC)   . PTSD (post-traumatic stress disorder)     Patient Active Problem List   Diagnosis Date Noted  . 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 Explosive personality disorder (HCC)   . Bipolar 2 disorder, major depressive episode (HCC)   . GAD (generalized anxiety disorder)   . Substance induced mood disorder (HCC) 03/24/2016  . Cocaine abuse (HCC) 03/24/2016  . PTSD (post-traumatic stress disorder) 03/24/2016  . COPD (chronic obstructive pulmonary disease) (HCC) 03/24/2016  . Antisocial personality disorder (HCC) 03/24/2016    History reviewed. No pertinent surgical history.  Prior to Admission medications   Medication Sig Start Date End Date Taking? Authorizing Provider  albuterol (PROVENTIL HFA;VENTOLIN HFA) 108 (90 Base) MCG/ACT inhaler Inhale 2 puffs into  the lungs 4 (four) times daily as needed for wheezing or shortness of breath.   Yes [provider]  clonazePAM (KLONOPIN) 0.5 MG tablet Take 0.5 mg by mouth 3 (three) times daily.  11/22/17  Yes [provider]  ibuprofen (ADVIL,MOTRIN) 800 MG tablet Take 800 mg by mouth 3 (three) times daily. 01/07/18  Yes [provider]  loxapine (LOXITANE) 10 MG capsule Take 20 mg by mouth at bedtime. 01/04/18  Yes [provider]  LYRICA 200 MG capsule Take 100 mg by mouth 2 (two) times daily.  11/22/17  Yes [provider]  QUEtiapine (SEROQUEL) 200 MG tablet Take 1 tablet (200 mg total) by mouth at bedtime. 03/25/16  Yes Withrow, Everardo AllJohn C, FNP  hydrOXYzine (ATARAX/VISTARIL) 25 MG tablet Take 1 tablet (25 mg total) by mouth every 6 (six) hours as needed for anxiety. Patient not taking: Reported on 02/10/2018 03/25/16   Beau FannyWithrow, Andranik C, FNP  lamoTRIgine (LAMICTAL) 25 MG tablet Take 2 tablets (50 mg total) by mouth daily. Patient not taking: Reported on 02/10/2018 03/26/16   Beau FannyWithrow, Leverne C, FNP    Allergies Patient has no known allergies.  History reviewed. No pertinent family history.  Social History Social History   Tobacco Use  . Smoking status: Former Games developermoker  . Smokeless tobacco: Never Used  Substance Use Topics  . Alcohol use: Yes  . Drug use: Yes    Types: Cocaine, Marijuana    Review of Systems  Constitutional: No fever/chills. Eyes: No visual changes. ENT: No sore throat. Cardiovascular: Denies chest pain. Respiratory: Positive for shortness of breath. Gastrointestinal: No abdominal  pain.  No nausea, no vomiting.  No diarrhea.  No constipation. Genitourinary: Negative for dysuria. Musculoskeletal: Negative for back pain. Skin: Negative for rash. Neurological: Negative for headaches, focal weakness or numbness. Psychiatric:Anxious, agitated.  ____________________________________________   PHYSICAL EXAM:  VITAL SIGNS: ED Triage Vitals    Enc Vitals Group     BP 02/10/18 0554 (!) 154/100     Pulse Rate 02/10/18 0554 (!) 125     Resp 02/10/18 0554 (!) 22     Temp 02/10/18 0554 (!) 97.2 F (36.2 C)     Temp Source 02/10/18 0554 Axillary     SpO2 02/10/18 0554 100 %     Weight 02/10/18 0556 140 lb (63.5 kg)     Height 02/10/18 0556 5\' 11"  (1.803 m)     Head Circumference --      Peak Flow --      Pain Score 02/10/18 0556 10     Pain Loc --      Pain Edu? --      Excl. in GC? --     Constitutional: Alert and oriented.  Disheveled appearing and in moderate acute distress. Eyes: Conjunctivae are normal. PERRL. EOMI. Head: Atraumatic. Nose: No congestion/rhinnorhea. Mouth/Throat: Mucous membranes are moist.  Oropharynx non-erythematous. Neck: No stridor.   Cardiovascular: Tachycardic rate, regular rhythm. Grossly normal heart sounds.  Good peripheral circulation. Respiratory: Increased respiratory effort.  No retractions. Lungs with audible wheezing and rhonchi. Gastrointestinal: Soft and nontender. No distention. No abdominal bruits. No CVA tenderness. Musculoskeletal: No lower extremity tenderness nor edema.  No joint effusions. Neurologic:  Normal speech and language. No gross focal neurologic deficits are appreciated.  Skin:  Skin is warm, dry and intact. No rash noted. Psychiatric: Mood and affect are anxious and agitated. Speech and behavior are agitated.  ____________________________________________   LABS (all labs ordered are listed, but only abnormal results are displayed)  Labs Reviewed  COMPREHENSIVE METABOLIC PANEL - Abnormal; Notable for the following components:      Result Value   Glucose, Bld 112 (*)    BUN 22 (*)    Creatinine, Ser 1.25 (*)    All other components within normal limits  BLOOD GAS, VENOUS - Abnormal; Notable for the following components:   pH, Ven 7.21 (*)    pCO2, Ven 86 (*)    Bicarbonate 34.4 (*)    Acid-Base Excess 2.8 (*)    All other components within normal limits   CBC WITH DIFFERENTIAL/PLATELET  ETHANOL  TROPONIN I  URINE DRUG SCREEN, QUALITATIVE ([facility] ONLY)   ____________________________________________  EKG  ED ECG REPORT I, Sandra Tellefsen J, the attending physician, personally viewed and interpreted this ECG.   Date: 02/10/2018  EKG Time: 0554  Rate: 128  Rhythm: sinus tachycardia  Axis: RAD  Intervals:none  ST&T Change: Nonspecific  ____________________________________________  RADIOLOGY  ED MD interpretation: No pneumonia  Official radiology report(s): Dg Chest Port 1 View  Result Date: 02/10/2018 CLINICAL DATA:  42 year old male with shortness of breath. EXAM: PORTABLE CHEST 1 VIEW COMPARISON:  Chest radiograph dated 05/20/2017 FINDINGS: There is emphysematous changes of the lungs with hyperexpansion. No focal consolidation, pleural effusion, or pneumothorax. The cardiac silhouette is within normal limits. No acute osseous pathology. IMPRESSION: No active disease. Electronically Signed   By: Elgie Collard M.D.   On: 02/10/2018 06:14    ____________________________________________   PROCEDURES  Procedure(s) performed: None  Procedures  Critical Care performed: Yes, see critical care note(s)   CRITICAL CARE Performed by: Chiquita Loth  J   Total critical care time: 30 minutes  Critical care time was exclusive of separately billable procedures and treating other patients.  Critical care was necessary to treat or prevent imminent or life-threatening deterioration.  Critical care was time spent personally by me on the following activities: development of treatment plan with patient and/or surrogate as well as nursing, discussions with consultants, evaluation of patient's response to treatment, examination of patient, obtaining history from patient or surrogate, ordering and performing treatments and interventions, ordering and review of laboratory studies, ordering and review of radiographic studies, pulse oximetry and  re-evaluation of patient's condition.  ____________________________________________   INITIAL IMPRESSION / ASSESSMENT AND PLAN / ED COURSE  As part of my medical decision making, I reviewed the following data within the electronic MEDICAL RECORD NUMBER Nursing notes reviewed and incorporated, Labs reviewed, EKG interpreted, Old chart reviewed, Radiograph reviewed and Notes from prior ED visits.   42 year old male with anxiety, COPD, cocaine use who presents with respiratory distress, hypoxia, requiring CPAP. Differential includes, but is not limited to, viral syndrome, bronchitis including COPD exacerbation, pneumonia, reactive airway disease including asthma, CHF including exacerbation with or without pulmonary/interstitial edema, pneumothorax, ACS, thoracic trauma, and pulmonary embolism.  Patient arrives agitated and combative, throwing medical equipment at staff.  Ativan given for calming.  Will obtain screening lab work, x-ray; administer nebulizer treatments, IV Solu-Medrol and reassess.  Clinical Course as of Feb 11 655  Fri Feb 10, 2018  0622 Patient appears somewhat more comfortable on BiPAP.  Wheezing remains.  Will administer second nebulizer treatment.  This will be patient's third nebulizer treatment in total.  Updated patient on laboratory and imaging results.  Discussed with hospitalist who will evaluate patient in emergency department for admission.  [JS]    Clinical Course User Index [JS] Irean Hong, MD     ____________________________________________   FINAL CLINICAL IMPRESSION(S) / ED DIAGNOSES  Final diagnoses:  COPD exacerbation (HCC)  SOB (shortness of breath)  Hypoxia  Anxiety  Acute respiratory failure with hypoxia and hypercarbia [facility])     ED Discharge Orders    None       Note:  This document was prepared using Dragon voice recognition software and may include unintentional dictation errors.    Irean Hong, MD 02/10/18 [phone]

## 2018-02-10 NOTE — Progress Notes (Signed)
Discharge instructions given and went over with patient and wife at bedside. Prescriptions given and reviewed. All questions answered. Patient discharged. Bo McclintockBrewer,Rebecca Motta S, RN

## 2018-02-10 NOTE — Discharge Instructions (Signed)
Resume diet and activity as before  Follow up with your doctor in a week  Quit cocaine and tobacco use

## 2018-02-10 NOTE — ED Notes (Signed)
RT to bedside to transfer pt from Bipap to nasal cannula.

## 2018-02-10 NOTE — ED Notes (Addendum)
This RN to bedside to administer meds, pt states to girlfriend, "Give me one of each."  Girlfriend proceeds to take pills out of unknown pill bottle in sweatshirt pocket and attempt to give to patient.  Pt and girlfriend made aware of hospital policy in regards to medication.  Girlfriend given the option to take medications to vehicle or give them to me so they can be locked up with pharmacy.  Pt becomes irritated, states, "we dont have a car, you are not taking my medications, I will leave this hospital if you take my meds."  Pt again advised of hospital policy.  Girlfriend exits the room at this time.  Tiffany, ChiropodistAssistant Director advised of incident.  Will also report incident to floor RN at time of transfer.

## 2018-02-10 NOTE — Progress Notes (Signed)
Patient is sleeping and wife does not want him to be disturbed. No medications given. Wife refusing admission profile to be completed at this time. Bo McclintockBrewer,Dashaun Onstott S, RN

## 2018-02-10 NOTE — H&P (Signed)
[employer] [facility] [place] [facility]   PATIENT NAME: Reginald Hopkins    MR#:  [id number]  DATE OF BIRTH:  [birth year]  DATE OF ADMISSION:  02/10/2018  PRIMARY CARE PHYSICIAN: Patient, No Pcp Per   REQUESTING/REFERRING PHYSICIAN: Dr. Dolores Frame  CHIEF COMPLAINT:   Chief Complaint  Patient presents with  . Shortness of Breath    HISTORY OF PRESENT ILLNESS:  Reginald Hopkins  is a 42 y.o. male with a known history of COPD, tobacco use, cocaine use, chronic pain presents with trouble breathing. He was placed on bipap. Now he is still SOB but seems improved. Will be admitted on [place] for COPD exacerbation and hypoxic resp failure. Received ativan in ED  PAST MEDICAL HISTORY:   Past Medical History:  Diagnosis Date  . Anxiety   . Back pain   . COPD (chronic obstructive pulmonary disease) (HCC)   . PTSD (post-traumatic stress disorder)     PAST SURGICAL HISTORY:  History reviewed. No pertinent surgical history.  SOCIAL HISTORY:   Social History   Tobacco Use  . Smoking status: Former Games developer  . Smokeless tobacco: Never Used  Substance Use Topics  . Alcohol use: Yes    FAMILY HISTORY:  History reviewed. No pertinent family history.  DRUG ALLERGIES:  No Known Allergies  REVIEW OF SYSTEMS:   Review of Systems  Constitutional: Positive for malaise/fatigue. Negative for chills and fever.  HENT: Negative for sore throat.   Eyes: Negative for blurred vision, double vision and pain.  Respiratory: Positive for cough, shortness of breath and wheezing. Negative for hemoptysis.   Cardiovascular: Negative for chest pain, palpitations, orthopnea and leg swelling.  Gastrointestinal: Negative for abdominal pain, constipation, diarrhea, heartburn, nausea and vomiting.  Genitourinary: Negative for dysuria and hematuria.  Musculoskeletal: Negative for back pain and joint pain.  Skin: Negative for rash.  Neurological: Positive for weakness. Negative for sensory change, speech change,  focal weakness and headaches.  Endo/Heme/Allergies: Does not bruise/bleed easily.  Psychiatric/Behavioral: Negative for depression. The patient is nervous/anxious.     MEDICATIONS AT HOME:   Prior to Admission medications   Medication Sig Start Date End Date Taking? Authorizing Provider  albuterol (PROVENTIL HFA;VENTOLIN HFA) 108 (90 Base) MCG/ACT inhaler Inhale 2 puffs into the lungs 4 (four) times daily as needed for wheezing or shortness of breath.   Yes [provider]  clonazePAM (KLONOPIN) 0.5 MG tablet Take 0.5 mg by mouth 3 (three) times daily.  11/22/17  Yes [provider]  ibuprofen (ADVIL,MOTRIN) 800 MG tablet Take 800 mg by mouth 3 (three) times daily. 01/07/18  Yes [provider]  loxapine (LOXITANE) 10 MG capsule Take 20 mg by mouth at bedtime. 01/04/18  Yes [provider]  LYRICA 200 MG capsule Take 100 mg by mouth 2 (two) times daily.  11/22/17  Yes [provider]  QUEtiapine (SEROQUEL) 200 MG tablet Take 1 tablet (200 mg total) by mouth at bedtime. 03/25/16  Yes Withrow, Everardo All, FNP  hydrOXYzine (ATARAX/VISTARIL) 25 MG tablet Take 1 tablet (25 mg total) by mouth every 6 (six) hours as needed for anxiety. Patient not taking: Reported on 02/10/2018 03/25/16   Beau Fanny, FNP  lamoTRIgine (LAMICTAL) 25 MG tablet Take 2 tablets (50 mg total) by mouth daily. Patient not taking: Reported on 02/10/2018 03/26/16   Beau Fanny, FNP     VITAL SIGNS:  Blood pressure 104/79, pulse (!) 113, temperature (!) 97.2 F (36.2 C), temperature source Axillary, resp. rate  18, height 5\' 11"  (1.803 m), weight 63.5 kg (140 lb), SpO2 99 %.  PHYSICAL EXAMINATION:  Physical Exam  GENERAL:  42 y.o.-year-old patient lying in the bed  EYES: Pupils equal, round, reactive to light and accommodation. No scleral icterus. Extraocular muscles intact.  HEENT: Head atraumatic, normocephalic. Oropharynx and nasopharynx clear. No oropharyngeal erythema, moist  oral mucosa  NECK:  Supple, no jugular venous distention. No thyroid enlargement, no tenderness.  LUNGS: Normal breath sounds bilaterally, no wheezing, rales, rhonchi. No use of accessory muscles of respiration.  CARDIOVASCULAR: wheezing, decreased air entry ABDOMEN: Soft, nontender, nondistended. Bowel sounds present. No organomegaly or mass.  EXTREMITIES: No pedal edema, cyanosis, or clubbing. + 2 pedal & radial pulses b/l.   NEUROLOGIC: Cranial nerves II through XII are intact. No focal Motor or sensory deficits appreciated b/l PSYCHIATRIC: The patient is drowzy but able to answer questions SKIN: No obvious rash, lesion, or ulcer.   LABORATORY PANEL:   CBC Recent Labs  Lab 02/10/18 0543  WBC 7.5  HGB 16.1  HCT 47.3  PLT 230   ------------------------------------------------------------------------------------------------------------------  Chemistries  Recent Labs  Lab 02/10/18 0543  NA 142  K 3.9  CL 105  CO2 31  GLUCOSE 112*  BUN 22*  CREATININE 1.25*  CALCIUM 9.4  AST 25  ALT 30  ALKPHOS 55  BILITOT 0.9   ------------------------------------------------------------------------------------------------------------------  Cardiac Enzymes Recent Labs  Lab 02/10/18 0543  TROPONINI <0.03   ------------------------------------------------------------------------------------------------------------------  RADIOLOGY:  Dg Chest Port 1 View  Result Date: 02/10/2018 CLINICAL DATA:  42 year old male with shortness of breath. EXAM: PORTABLE CHEST 1 VIEW COMPARISON:  Chest radiograph dated 05/20/2017 FINDINGS: There is emphysematous changes of the lungs with hyperexpansion. No focal consolidation, pleural effusion, or pneumothorax. The cardiac silhouette is within normal limits. No acute osseous pathology. IMPRESSION: No active disease. Electronically Signed   By: Elgie CollardArash  Radparvar M.D.   On: 02/10/2018 06:14   IMPRESSION AND PLAN:   * Acute COPD exacerbation -IV  steroids - Scheduled Nebulizers - Inhalers -Wean O2 as tolerated - Consult pulmonary if no improvement -Check Flu  * Chronic back pain Ibuprofen tylenol Lyrica  * Cocaine abuse Will continue klonopin from home PRN for any withdrawals Tele   * DVT prophylaxis Lovenox   All the records are reviewed and case discussed with ED provider. Management plans discussed with the patient, family and they are in agreement.  CODE STATUS: FULL CODE  TOTAL TIME TAKING CARE OF THIS PATIENT: 45 minutes.   Molinda BailiffSrikar R Tavonte Seybold M.D on 02/10/2018 at 8:05 AM  Between 7am to 6pm - Pager - [phone]  After 6pm go to [email or web address] - password [email or web address]  [facility] [place] [facility]  Office  [phone]  CC: Primary care physician; Patient, No Pcp Per  Note: This dictation was prepared with Dragon dictation along with smaller phrase technology. Any transcriptional errors that result from this process are unintentional.

## 2018-02-10 NOTE — ED Triage Notes (Signed)
Pt arrived via EMS on bipap from home with complaints of SOB that started around 4:00am this morning. Pt has Hx of anxiety and COPD. Pt is not normally on oxygen at home. Per EMS pt lungs sound "bubbly". Per EMS pt received 1 inch of Nitro and 2 Duonebs. Pt took his own Clonipine at home. Pt told EMS that he took $40 worth of cocaine today and he has been doing so consistency since he was 42 years old. Per EMS pt has a 20 in Left AC. VS per EMS BP-163/134 O2sat- 80s RA 12-Lead was sinus tach. Pt is alert and oriented x 4. Fiance is on the way to hospital.

## 2018-02-10 NOTE — ED Notes (Signed)
Pt requesting pain medication for chronic back pain, states, "I take Lyrica but I need something stronger, my doctor is referring me to a pain clinic."  Hospitalist to bedside, pt requesting the same.  Pt offered Ibuprofen or Tylenol in addition to Lyrica, pt declines, states, "Can I get a percocet or a Tylenol 3?"  Hospitalist declines.

## 2018-02-10 NOTE — Discharge Summary (Signed)
[employer] [facility] - [place] at [facility]   PATIENT NAME: Reginald Hopkins    MR#:  [id number]  DATE OF BIRTH:  01-06-1976  DATE OF ADMISSION:  02/10/2018 ADMITTING PHYSICIAN: Milagros Loll, MD  DATE OF DISCHARGE: 02/10/2018  PRIMARY CARE PHYSICIAN: Patient, No Pcp Per   ADMISSION DIAGNOSIS:  Anxiety [F41.9] SOB (shortness of breath) [R06.02] Hypoxia [R09.02] COPD exacerbation (HCC) [J44.1] Acute respiratory failure with hypoxia and hypercarbia (HCC) [J96.01, J96.02]  DISCHARGE DIAGNOSIS:  Active Problems:   COPD exacerbation (HCC)   SECONDARY DIAGNOSIS:   Past Medical History:  Diagnosis Date  . Anxiety   . Back pain   . COPD (chronic obstructive pulmonary disease) (HCC)   . PTSD (post-traumatic stress disorder)      ADMITTING HISTORY  HISTORY OF PRESENT ILLNESS:  Reginald Hopkins  is a 42 y.o. male with a known history of COPD, tobacco use, cocaine use, chronic pain presents with trouble breathing. He was placed on bipap. Now he is still SOB but seems improved. Will be admitted on [place] for COPD exacerbation and hypoxic resp failure. Received ativan in ED  HOSPITAL COURSE:   *Acute COPD exacerbation.  Patient was started on IV steroids, nebulizers.  He was initially placed on BiPAP in the emergency room but was quickly transitioned to nasal cannula.  Admitted to the hospital.  Slowly weaned off oxygen.  Patient turned around well.  Presently he is ambulating in the room without any problems.  Saturations are 98% on room air.  On reexamination his lungs are clear. Patient will be discharged home with DuoNeb nebulizer, prednisone, Robitussin.  *Chronic back pain.  Patient is on ibuprofen at home which is continued.  Also Lyrica from home.  *Cocaine and tobacco abuse.  Counseled to quit.  Patient is on disability due to back problems.  Patient was seen taking of pills passed on by his fianc in the hospital.  Unknown medication.  Patient was abusive towards the staff  during the hospital stay and got agitated when we demanded that he not take his unknown pills. He continued to swear while going over discharge instructions.   CONSULTS OBTAINED:    DRUG ALLERGIES:  No Known Allergies  DISCHARGE MEDICATIONS:   Allergies as of 02/10/2018   No Known Allergies     Medication List    STOP taking these medications   albuterol 108 (90 Base) MCG/ACT inhaler Commonly known as:  PROVENTIL HFA;VENTOLIN HFA   hydrOXYzine 25 MG tablet Commonly known as:  ATARAX/VISTARIL     TAKE these medications   clonazePAM 0.5 MG tablet Commonly known as:  KLONOPIN Take 0.5 mg by mouth 3 (three) times daily.   guaiFENesin-dextromethorphan 100-10 MG/5ML syrup Commonly known as:  ROBITUSSIN DM Take 5 mLs by mouth every 4 (four) hours as needed for cough.   ibuprofen 800 MG tablet Commonly known as:  ADVIL,MOTRIN Take 800 mg by mouth 3 (three) times daily.   ipratropium-albuterol 0.5-2.5 (3) MG/3ML Soln Commonly known as:  DUONEB Take 3 mLs by nebulization every 6 (six) hours as needed.   lamoTRIgine 25 MG tablet Commonly known as:  LAMICTAL Take 2 tablets (50 mg total) by mouth daily.   loxapine 10 MG capsule Commonly known as:  LOXITANE Take 20 mg by mouth at bedtime.   LYRICA 200 MG capsule Generic drug:  pregabalin Take 100 mg by mouth 2 (two) times daily.   Nebulizer [employer] machine and supplies   predniSONE 20 MG tablet Commonly known as:  DELTASONE Take 2 tablets (40 mg total) by mouth daily.   QUEtiapine 200 MG tablet Commonly known as:  SEROQUEL Take 1 tablet (200 mg total) by mouth at bedtime.       Today   VITAL SIGNS:  Blood pressure 111/68, pulse 100, temperature (!) 97.5 F (36.4 C), temperature source Oral, resp. rate 20, height 5\' 11"  (1.803 m), weight 63.5 kg (140 lb), SpO2 100 %.  I/O:  No intake or output data in the 24 hours ending 02/10/18 1505  PHYSICAL EXAMINATION:  Physical Exam  GENERAL:  42  y.o.-year-old patient lying in the bed with no acute distress.  LUNGS: Normal breath sounds bilaterally, no wheezing, rales,rhonchi or crepitation. No use of accessory muscles of respiration.  CARDIOVASCULAR: S1, S2 normal. No murmurs, rubs, or gallops.  ABDOMEN: Soft, non-tender, non-distended. Bowel sounds present. No organomegaly or mass.  NEUROLOGIC: Moves all 4 extremities. PSYCHIATRIC: The patient is alert and oriented x 3.  SKIN: No obvious rash, lesion, or ulcer.   DATA REVIEW:   CBC Recent Labs  Lab 02/10/18 0543  WBC 7.5  HGB 16.1  HCT 47.3  PLT 230    Chemistries  Recent Labs  Lab 02/10/18 0543  NA 142  K 3.9  CL 105  CO2 31  GLUCOSE 112*  BUN 22*  CREATININE 1.25*  CALCIUM 9.4  AST 25  ALT 30  ALKPHOS 55  BILITOT 0.9    Cardiac Enzymes Recent Labs  Lab 02/10/18 0543  TROPONINI <0.03    Microbiology Results  Results for orders placed or performed during the hospital encounter of 03/24/16  Chlamydia/NGC rt PCR ([facility] only)     Status: Abnormal   Collection Time: 03/24/16  6:38 PM  Result Value Ref Range Status   Specimen source GC/Chlam URINE, RANDOM  Final   Chlamydia Tr DETECTED (A) NOT DETECTED Final   N gonorrhoeae NOT DETECTED NOT DETECTED Final    Comment: (NOTE) 100  This methodology has not been evaluated in pregnant women or in 200  patients with a history of hysterectomy. 300 400  This methodology will not be performed on patients less than 73  years of age.     RADIOLOGY:  Dg Chest Port 1 View  Result Date: 02/10/2018 CLINICAL DATA:  42 year old male with shortness of breath. EXAM: PORTABLE CHEST 1 VIEW COMPARISON:  Chest radiograph dated 05/20/2017 FINDINGS: There is emphysematous changes of the lungs with hyperexpansion. No focal consolidation, pleural effusion, or pneumothorax. The cardiac silhouette is within normal limits. No acute osseous pathology. IMPRESSION: No active disease. Electronically Signed   By: Elgie Collard  M.D.   On: 02/10/2018 06:14    Follow up with PCP in 1 week.  Management plans discussed with the patient, family and they are in agreement.  CODE STATUS:     Code Status Orders  (From admission, onward)        Start     Ordered   02/10/18 0753  Full code  Continuous     02/10/18 0802    Code Status History    Date Active Date Inactive Code Status Order ID Comments User Context   03/25/2016 02:27 03/25/2016 21:21 Full Code [id number]  Kerry Hough, PA-C Inpatient      TOTAL TIME TAKING CARE OF THIS PATIENT ON DAY OF DISCHARGE: more than 30 minutes.   Molinda Bailiff Kaya Klausing M.D on 02/10/2018 at 3:05 PM  Between 7am to 6pm - Pager - [phone]  After 6pm go to  [email or web address] - password [email or web address]  [facility] [place] [facility]  Office  [phone]  CC: Primary care physician; Patient, No Pcp Per  Note: This dictation was prepared with Dragon dictation along with smaller phrase technology. Any transcriptional errors that result from this process are unintentional.

## 2018-02-14 LAB — BLOOD GAS, VENOUS
Acid-Base Excess: 2.8 mmol/L — ABNORMAL HIGH (ref 0.0–2.0)
Bicarbonate: 34.4 mmol/L — ABNORMAL HIGH (ref 20.0–28.0)
PATIENT TEMPERATURE: 37
pCO2, Ven: 86 mmHg (ref 44.0–60.0)
pH, Ven: 7.21 — ABNORMAL LOW (ref 7.250–7.430)

## 2018-03-14 ENCOUNTER — Emergency Department
Admission: EM | Admit: 2018-03-14 | Discharge: 2018-03-14 | Disposition: A | Discharge status: Home / Self Care | Payer: Medicare Other | Attending: Emergency Medicine | Admitting: Emergency Medicine

## 2018-03-14 ENCOUNTER — Emergency Department: Payer: Medicare Other

## 2018-03-14 DIAGNOSIS — F410 Panic disorder [episodic paroxysmal anxiety] without agoraphobia: Secondary | ICD-10-CM

## 2018-03-14 DIAGNOSIS — R0602 Shortness of breath: Secondary | ICD-10-CM | POA: Diagnosis present

## 2018-03-14 DIAGNOSIS — I509 Heart failure, unspecified: Secondary | ICD-10-CM | POA: Insufficient documentation

## 2018-03-14 DIAGNOSIS — J449 Chronic obstructive pulmonary disease, unspecified: Secondary | ICD-10-CM | POA: Insufficient documentation

## 2018-03-14 DIAGNOSIS — Z87891 Personal history of nicotine dependence: Secondary | ICD-10-CM | POA: Diagnosis not present

## 2018-03-14 DIAGNOSIS — F1410 Cocaine abuse, uncomplicated: Secondary | ICD-10-CM

## 2018-03-14 DIAGNOSIS — Z79899 Other long term (current) drug therapy: Secondary | ICD-10-CM | POA: Insufficient documentation

## 2018-03-14 HISTORY — DX: Heart failure, unspecified: I50.9

## 2018-03-14 LAB — CBC
HEMATOCRIT: 48 % (ref 40.0–52.0)
HEMOGLOBIN: 16.4 g/dL (ref 13.0–18.0)
MCH: 29.9 pg (ref 26.0–34.0)
MCHC: 34.2 g/dL (ref 32.0–36.0)
MCV: 87.3 fL (ref 80.0–100.0)
Platelets: 278 10*3/uL (ref 150–440)
RBC: 5.5 MIL/uL (ref 4.40–5.90)
RDW: 13.7 % (ref 11.5–14.5)
WBC: 5.6 10*3/uL (ref 3.8–10.6)

## 2018-03-14 LAB — BASIC METABOLIC PANEL
ANION GAP: 6 (ref 5–15)
BUN: 14 mg/dL (ref 6–20)
CHLORIDE: 106 mmol/L (ref 101–111)
CO2: 30 mmol/L (ref 22–32)
Calcium: 9.5 mg/dL (ref 8.9–10.3)
Creatinine, Ser: 1.13 mg/dL (ref 0.61–1.24)
GFR calc Af Amer: >60 mL/min (ref >60)
GFR calc non Af Amer: >60 mL/min (ref >60)
Glucose, Bld: 100 mg/dL — ABNORMAL HIGH (ref 65–99)
POTASSIUM: 3.7 mmol/L (ref 3.5–5.1)
Sodium: 142 mmol/L (ref 135–145)

## 2018-03-14 LAB — ETHANOL: Alcohol, Ethyl (B): <10 mg/dL (ref <10)

## 2018-03-14 LAB — TROPONIN I
Troponin I: <0.03 ng/mL (ref <0.03)
Troponin I: <0.03 ng/mL (ref <0.03)

## 2018-03-14 MED ORDER — LORAZEPAM 2 MG/ML IJ SOLN
INTRAMUSCULAR | Status: AC
  Filled 2018-03-14: qty 1

## 2018-03-14 MED ORDER — LORAZEPAM 2 MG/ML IJ SOLN
1.0000 mg | Freq: Once | INTRAMUSCULAR | Status: AC
  Administered 2018-03-14: 1 mg via INTRAVENOUS

## 2018-03-14 MED ORDER — KETOROLAC TROMETHAMINE 30 MG/ML IJ SOLN
30.0000 mg | Freq: Once | INTRAMUSCULAR | Status: AC
  Administered 2018-03-14: 30 mg via INTRAVENOUS
  Filled 2018-03-14: qty 1

## 2018-03-14 NOTE — ED Provider Notes (Signed)
[facility] Emergency Department Provider Note ___________________________   First MD Initiated Contact with Patient 03/14/18 [id number]     (approximate)  I have reviewed the triage vital signs and the nursing notes.   HISTORY  Chief Complaint Shortness of Breath    HPI Reginald RuizJohn Claudie Leachhomas Hopkins is a 42 y.o. male below list of chronic medical conditions including COPD and cocaine abuse presents to the emergency department via EMS with acute onset of dyspnea before arrival.  Per EMS patient's oxygen saturation was 70s on their arrival however after DuoNeb's patient's oxygen saturation improved to 96%.  Patient does admit to cocaine and alcohol use today.  On arrival patient tachycardic with a heart rate of 135   Past Medical History:  Diagnosis Date  . Anxiety   . Back pain   . CHF (congestive heart failure) (HCC)   . COPD (chronic obstructive pulmonary disease) (HCC)   . PTSD (post-traumatic stress disorder)     Patient Active Problem List   Diagnosis Date Noted  . COPD exacerbation (HCC) 02/10/2018  . 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 Explosive personality disorder (HCC)   . Bipolar 2 disorder, major depressive episode (HCC)   . GAD (generalized anxiety disorder)   . Substance induced mood disorder (HCC) 03/24/2016  . Cocaine abuse (HCC) 03/24/2016  . PTSD (post-traumatic stress disorder) 03/24/2016  . COPD (chronic obstructive pulmonary disease) (HCC) 03/24/2016  . Antisocial personality disorder (HCC) 03/24/2016    Past Surgical History:  Procedure Laterality Date  . BACK SURGERY      Prior to Admission medications   Medication Sig Start Date End Date Taking? Authorizing Provider  clonazePAM (KLONOPIN) 0.5 MG tablet Take 0.5 mg by mouth 3 (three) times daily.  11/22/17   [provider]  guaiFENesin-dextromethorphan (ROBITUSSIN DM) 100-10 MG/5ML syrup Take 5 mLs by mouth every 4 (four) hours as  needed for cough. 02/10/18   Milagros LollSudini, Srikar, MD  ibuprofen (ADVIL,MOTRIN) 800 MG tablet Take 800 mg by mouth 3 (three) times daily. 01/07/18   [provider]  ipratropium-albuterol (DUONEB) 0.5-2.5 (3) MG/3ML SOLN Take 3 mLs by nebulization every 6 (six) hours as needed. 02/10/18   Milagros LollSudini, Srikar, MD  lamoTRIgine (LAMICTAL) 25 MG tablet Take 2 tablets (50 mg total) by mouth daily. Patient not taking: Reported on 02/10/2018 03/26/16   Withrow, Everardo AllJohn C, FNP  loxapine (LOXITANE) 10 MG capsule Take 20 mg by mouth at bedtime. 01/04/18   [provider]  LYRICA 200 MG capsule Take 100 mg by mouth 2 (two) times daily.  11/22/17   [provider]  predniSONE (DELTASONE) 20 MG tablet Take 2 tablets (40 mg total) by mouth daily. 02/10/18   Milagros LollSudini, Srikar, MD  QUEtiapine (SEROQUEL) 200 MG tablet Take 1 tablet (200 mg total) by mouth at bedtime. 03/25/16   Withrow, Everardo AllJohn C, FNP  Respiratory Therapy Supplies (NEBULIZER) DEVI Nebulizer machine and supplies 02/10/18   Milagros LollSudini, Srikar, MD    Allergies No known drug allergies History reviewed. No pertinent family history.  Social History Social History   Tobacco Use  . Smoking status: Former Games developermoker  . Smokeless tobacco: Never Used  Substance Use Topics  . Alcohol use: Yes  . Drug use: Yes    Types: Cocaine, Marijuana    Review of Systems Constitutional: No fever/chills Eyes: No visual changes. ENT: No sore throat. Cardiovascular: Denies chest pain. Respiratory: Positive for shortness of breath. Gastrointestinal: No abdominal pain.  No  nausea, no vomiting.  No diarrhea.  No constipation. Genitourinary: Negative for dysuria. Musculoskeletal: Negative for neck pain.  Negative for back pain. Integumentary: Negative for rash. Neurological: Negative for headaches, focal weakness or numbness.   ____________________________________________   PHYSICAL EXAM:  VITAL SIGNS: ED Triage Vitals  Enc Vitals Group     BP 03/14/18 0415 (!)  148/105     Pulse Rate 03/14/18 0417 (!) 135     Resp 03/14/18 0415 18     Temp 03/14/18 0420 (!) 97.4 F (36.3 C)     Temp Source 03/14/18 0417 Oral     SpO2 03/14/18 0417 97 %     Weight 03/14/18 0419 65.8 kg (145 lb)     Height 03/14/18 0419 1.803 m (5\' 11" )     Head Circumference --      Peak Flow --      Pain Score 03/14/18 0420 10     Pain Loc --      Pain Edu? --      Excl. in GC? --     Constitutional: Alert and oriented.  Generalized tremulousness, very anxious affect  eyes: Conjunctivae are normal.  Head: Atraumatic. Mouth/Throat: Mucous membranes are moist.  Oropharynx non-erythematous. Neck: No stridor.   Cardiovascular: Tachycardia, regular rhythm. Good peripheral circulation. Grossly normal heart sounds. Respiratory: Normal respiratory effort.  No retractions. Lungs CTAB. Gastrointestinal: Soft and nontender. No distention.  Musculoskeletal: No lower extremity tenderness nor edema. No gross deformities of extremities. Neurologic:  Normal speech and language. No gross focal neurologic deficits are appreciated.  Skin:  Skin is warm, dry and intact. No rash noted. Psychiatric: Very anxious affect. Speech and behavior are normal.  ____________________________________________   LABS (all labs ordered are listed, but only abnormal results are displayed)  Labs Reviewed  BASIC METABOLIC PANEL - Abnormal; Notable for the following components:      Result Value   Glucose, Bld 100 (*)    All other components within normal limits  CBC  TROPONIN I  ETHANOL  TROPONIN I   ____________________________________________  EKG  ED ECG REPORT I, [place] N Kairee Kozma, the attending physician, personally viewed and interpreted this ECG.   Date: 03/14/2018  EKG Time: 5:54 AM  Rate: 128  Rhythm: Sinus tachycardia  Axis: Normal  Intervals: Normal  ST&T Change: None  ____________________________________________  RADIOLOGY I, [place] N Laban Orourke, personally viewed and  evaluated these images (plain radiographs) as part of my medical decision making, as well as reviewing the written report by the radiologist.  ED MD interpretation: No acute cardiopulmonary findings per radiologist.  Official radiology report(s): Dg Chest Port 1 View  Result Date: 03/14/2018 CLINICAL DATA:  Shortness of breath, hypoxia. History of substance abuse. EXAM: PORTABLE CHEST 1 VIEW COMPARISON:  Chest radiograph February 10, 2018 FINDINGS: Cardiomediastinal silhouette is normal. No pleural effusions or focal consolidations. Stable hyperinflation. Trachea projects midline and there is no pneumothorax. Soft tissue planes and included osseous structures are non-suspicious. IMPRESSION: Hyperinflation without focal consolidation. Electronically Signed   By: Awilda Metro M.D.   On: 03/14/2018 04:54     .Critical Care Performed by: Darci Current, MD Authorized by: Darci Current, MD   Critical care provider statement:    Critical care time (minutes):  30   Critical care time was exclusive of:  Separately billable procedures and treating other patients   Critical care was necessary to treat or prevent imminent or life-threatening deterioration of the following conditions:  [place]   Critical  care was time spent personally by me on the following activities:  Development of treatment plan with patient or surrogate, discussions with consultants, evaluation of patient's response to treatment, examination of patient, obtaining history from patient or surrogate, ordering and performing treatments and interventions, ordering and review of laboratory studies, ordering and review of radiographic studies, pulse oximetry, re-evaluation of patient's condition and review of old charts   I assumed direction of critical care for this patient from another provider in my specialty: no       ____________________________________________   INITIAL IMPRESSION / ASSESSMENT AND PLAN / ED COURSE  As  part of my medical decision making, I reviewed the following data within the electronic MEDICAL RECORD NUMBER  41 year old male presenting with above-stated history and physical exam of anxiety tachycardia hypertension suspect cocaine toxicity as the etiology for the patient's symptoms and as such IV Ativan 1 mg was given with complete resolution of tachycardia hypertension and anxiety.  Given patient's chest discomfort EKG and laboratory data was obtained which was unremarkable will obtain repeat troponin which I anticipate to be negative as well.  Spoke with patient regarding dangers of cocaine including possibility of death. ____________________________________________  FINAL CLINICAL IMPRESSION(S) / ED DIAGNOSES  Final diagnoses:  Panic attack  Cocaine abuse (HCC)  Cocaine toxicity.   MEDICATIONS GIVEN DURING THIS VISIT:  Medications  LORazepam (ATIVAN) injection 1 mg (1 mg Intravenous Given 03/14/18 0422)  ketorolac (TORADOL) 30 MG/ML injection 30 mg (30 mg Intravenous Given 03/14/18 0452)     ED Discharge Orders    None       Note:  This document was prepared using Dragon voice recognition software and may include unintentional dictation errors.    Darci Current, MD 03/14/18 2253

## 2018-03-14 NOTE — ED Notes (Signed)
RN in room to d/c patient. Pt became belligerent with RN, cussing and raising voice. Officer Mabe called to room to assist with pt. Pt d/c without incident.  Pt provided with sandwich tray, drink, and bus pass

## 2018-03-14 NOTE — ED Triage Notes (Signed)
Pt arrived via EMS from home with complaint of SOB tonight. Pt has Hx of CHF. Pt was given 2 Duonebs that brought his O2 sat up from low 70s to high 96%. Pt has stated that he used cocaine and alcohol today. VS per EMS BP-170/70 HR-130s. Pt is alert and oriented x 4.

## 2018-03-14 NOTE — ED Notes (Addendum)
Pt is being extremely difficult and argumentative. Pt asked if he could take clonipine, this RN told him "no" along with Dr. Manson PasseyBrown giving an in depth reason for why he could not take clonopine. When Dr. Manson PasseyBrown left room pt stated that I could not tell him what to do and if he wanted to take it then he would because "its got my name on it". Shortly after this conversation, CT technician came and reported that the pt asked him if he could use "DIP". The tech told him "NO" and the pt told tech to turn his head and look the other way. Technician told pt that he would be letting the doctor and nurse know what he said, then he promptly came and told this RN and Dr. Manson PasseyBrown. Dr. Manson PasseyBrown went into the room to speak with pt about all this.

## 2018-03-14 NOTE — ED Notes (Signed)
This RN in room, RN informed pt that he is ready to be discharge and needed to call for ride home. Pt states that he is concerned about the shaking in his legs when he lifts them. Dr. Mayford KnifeWilliams informed and will re-evaluate pt.

## 2018-05-21 ENCOUNTER — Emergency Department: Payer: Medicare Other

## 2018-05-21 ENCOUNTER — Encounter: Payer: Self-pay | Admitting: Emergency Medicine

## 2018-05-21 ENCOUNTER — Emergency Department
Admission: EM | Admit: 2018-05-21 | Discharge: 2018-05-21 | Disposition: A | Discharge status: Home / Self Care | Payer: Medicare Other | Attending: Emergency Medicine | Admitting: Emergency Medicine

## 2018-05-21 DIAGNOSIS — Z87891 Personal history of nicotine dependence: Secondary | ICD-10-CM | POA: Diagnosis not present

## 2018-05-21 DIAGNOSIS — Z79899 Other long term (current) drug therapy: Secondary | ICD-10-CM | POA: Insufficient documentation

## 2018-05-21 DIAGNOSIS — I509 Heart failure, unspecified: Secondary | ICD-10-CM | POA: Insufficient documentation

## 2018-05-21 DIAGNOSIS — J441 Chronic obstructive pulmonary disease with (acute) exacerbation: Secondary | ICD-10-CM | POA: Diagnosis not present

## 2018-05-21 DIAGNOSIS — R0602 Shortness of breath: Secondary | ICD-10-CM | POA: Diagnosis present

## 2018-05-21 LAB — BASIC METABOLIC PANEL
ANION GAP: 9 (ref 5–15)
BUN: 19 mg/dL (ref 6–20)
CALCIUM: 9.7 mg/dL (ref 8.9–10.3)
CO2: 27 mmol/L (ref 22–32)
Chloride: 102 mmol/L (ref 101–111)
Creatinine, Ser: 1.2 mg/dL (ref 0.61–1.24)
GFR calc Af Amer: >60 mL/min (ref >60)
GFR calc non Af Amer: >60 mL/min (ref >60)
GLUCOSE: 82 mg/dL (ref 65–99)
POTASSIUM: 4.8 mmol/L (ref 3.5–5.1)
Sodium: 138 mmol/L (ref 135–145)

## 2018-05-21 LAB — TROPONIN I: Troponin I: <0.03 ng/mL (ref <0.03)

## 2018-05-21 LAB — CBC
HEMATOCRIT: 51.5 % (ref 40.0–52.0)
HEMOGLOBIN: 17.8 g/dL (ref 13.0–18.0)
MCH: 30.4 pg (ref 26.0–34.0)
MCHC: 34.5 g/dL (ref 32.0–36.0)
MCV: 88 fL (ref 80.0–100.0)
Platelets: 330 10*3/uL (ref 150–440)
RBC: 5.84 MIL/uL (ref 4.40–5.90)
RDW: 13.2 % (ref 11.5–14.5)
WBC: 7.9 10*3/uL (ref 3.8–10.6)

## 2018-05-21 MED ORDER — IPRATROPIUM-ALBUTEROL 0.5-2.5 (3) MG/3ML IN SOLN
3.0000 mL | Freq: Once | RESPIRATORY_TRACT | Status: AC
  Administered 2018-05-21: 3 mL via RESPIRATORY_TRACT
  Filled 2018-05-21: qty 3

## 2018-05-21 MED ORDER — PREDNISONE 50 MG PO TABS: ORAL_TABLET | ORAL | 0 refills | Status: DC

## 2018-05-21 MED ORDER — ACETAMINOPHEN 500 MG PO TABS
1000.0000 mg | ORAL_TABLET | Freq: Once | ORAL | Status: AC
Start: 2018-05-21 — End: 2018-05-21
  Administered 2018-05-21: 1000 mg via ORAL
  Filled 2018-05-21: qty 2

## 2018-05-21 MED ORDER — AZITHROMYCIN 250 MG PO TABS: ORAL_TABLET | ORAL | 0 refills | Status: AC

## 2018-05-21 MED ORDER — AZITHROMYCIN 500 MG PO TABS
500.0000 mg | ORAL_TABLET | Freq: Once | ORAL | Status: AC
  Administered 2018-05-21: 500 mg via ORAL
  Filled 2018-05-21: qty 1

## 2018-05-21 MED ORDER — ALBUTEROL SULFATE HFA 108 (90 BASE) MCG/ACT IN AERS: 2.0000 | INHALATION_SPRAY | Freq: Four times a day (QID) | RESPIRATORY_TRACT | 0 refills | Status: DC | PRN

## 2018-05-21 MED ORDER — METHYLPREDNISOLONE SODIUM SUCC 125 MG IJ SOLR
125.0000 mg | Freq: Once | INTRAMUSCULAR | Status: AC
  Administered 2018-05-21: 125 mg via INTRAVENOUS
  Filled 2018-05-21: qty 2

## 2018-05-21 MED ORDER — ALBUTEROL SULFATE (2.5 MG/3ML) 0.083% IN NEBU
5.0000 mg | INHALATION_SOLUTION | Freq: Once | RESPIRATORY_TRACT | Status: AC
  Administered 2018-05-21: 5 mg via RESPIRATORY_TRACT
  Filled 2018-05-21: qty 6

## 2018-05-21 NOTE — ED Provider Notes (Signed)
[facility] Emergency Department Provider Note   ____________________________________________    I have reviewed the triage vital signs and the nursing notes.   HISTORY  Chief Complaint Chest Pain and Shortness of Breath     HPI Reginald Hopkins is a 42 y.o. male who presents with complaints of shortness of breath.  Patient reports over the last several days he has had worsening shortness of breath and chest tightness.  Denies fevers or chills.  Does report dry cough.  No diaphoresis.  History of COPD, stopped smoking 5 years ago.  No calf pain or swelling.  No nausea vomiting.  Tried taking inhaler with little improvement   Past Medical History:  Diagnosis Date  . Anxiety   . Back pain   . CHF (congestive heart failure) (HCC)   . COPD (chronic obstructive pulmonary disease) (HCC)   . PTSD (post-traumatic stress disorder)     Patient Active Problem List   Diagnosis Date Noted  . COPD exacerbation (HCC) 02/10/2018  . 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 Explosive personality disorder (HCC)   . Bipolar 2 disorder, major depressive episode (HCC)   . GAD (generalized anxiety disorder)   . Substance induced mood disorder (HCC) 03/24/2016  . Cocaine abuse (HCC) 03/24/2016  . PTSD (post-traumatic stress disorder) 03/24/2016  . COPD (chronic obstructive pulmonary disease) (HCC) 03/24/2016  . Antisocial personality disorder (HCC) 03/24/2016    Past Surgical History:  Procedure Laterality Date  . BACK SURGERY      Prior to Admission medications   Medication Sig Start Date End Date Taking? Authorizing Provider  clonazePAM (KLONOPIN) 0.5 MG tablet Take 0.5 mg by mouth 3 (three) times daily.  11/22/17   [provider]  guaiFENesin-dextromethorphan (ROBITUSSIN DM) 100-10 MG/5ML syrup Take 5 mLs by mouth every 4 (four) hours as needed for cough. 02/10/18   Milagros Loll, MD  ibuprofen  (ADVIL,MOTRIN) 800 MG tablet Take 800 mg by mouth 3 (three) times daily. 01/07/18   [provider]  ipratropium-albuterol (DUONEB) 0.5-2.5 (3) MG/3ML SOLN Take 3 mLs by nebulization every 6 (six) hours as needed. 02/10/18   Milagros Loll, MD  lamoTRIgine (LAMICTAL) 25 MG tablet Take 2 tablets (50 mg total) by mouth daily. Patient not taking: Reported on 02/10/2018 03/26/16   Withrow, Everardo All, FNP  loxapine (LOXITANE) 10 MG capsule Take 20 mg by mouth at bedtime. 01/04/18   [provider]  LYRICA 200 MG capsule Take 100 mg by mouth 2 (two) times daily.  11/22/17   [provider]  predniSONE (DELTASONE) 20 MG tablet Take 2 tablets (40 mg total) by mouth daily. 02/10/18   Milagros Loll, MD  QUEtiapine (SEROQUEL) 200 MG tablet Take 1 tablet (200 mg total) by mouth at bedtime. 03/25/16   Withrow, Everardo All, FNP  Respiratory Therapy Supplies (NEBULIZER) [employer] Nebulizer machine and supplies 02/10/18   Milagros Loll, MD     Allergies Patient has no known allergies.  No family history on file.  Social History Social History   Tobacco Use  . Smoking status: Former Games developer  . Smokeless tobacco: Never Used  Substance Use Topics  . Alcohol use: Yes  . Drug use: Yes    Types: Cocaine, Marijuana    Review of Systems  Constitutional: No fever/chills Eyes: No visual changes.  ENT: No sore throat. Cardiovascular: As above Respiratory: As above Gastrointestinal: No abdominal pain.  No nausea, no vomiting.   Genitourinary: Negative  for dysuria. Musculoskeletal: Negative for back pain. Skin: Negative for rash. Neurological: Negative for headache   ____________________________________________   PHYSICAL EXAM:  VITAL SIGNS: ED Triage Vitals  Enc Vitals Group     BP 05/21/18 1315 123/85     Pulse Rate 05/21/18 1315 (!) 111     Resp 05/21/18 1315 (!) 24     Temp 05/21/18 1315 98.2 F (36.8 C)     Temp Source 05/21/18 1315 Oral     SpO2 05/21/18 1315 96 %     Weight  05/21/18 1319 65.8 kg (145 lb)     Height 05/21/18 1319 1.803 m (5\' 11" )     Head Circumference --      Peak Flow --      Pain Score 05/21/18 1319 9     Pain Loc --      Pain Edu? --      Excl. in GC? --     Constitutional: Alert and oriented.  Eyes: Conjunctivae are normal.  Head: Atraumatic.Nose: No congestion/rhinnorhea. Mouth/Throat: Mucous membranes are moist.   Neck:  Painless ROM Cardiovascular: Tachycardia regular rhythm. Grossly normal heart sounds.  Good peripheral circulation. Respiratory: Increased respiratory effort with wheezing bilaterally Gastrointestinal: Soft and nontender. No distention.  No CVA tenderness. Genitourinary: deferred Musculoskeletal: No lower extremity tenderness nor edema.  Warm and well perfused Neurologic:  Normal speech and language. No gross focal neurologic deficits are appreciated.  Skin:  Skin is warm, dry and intact. No rash noted. Psychiatric: Mood and affect are normal. Speech and behavior are normal.  ____________________________________________   LABS (all labs ordered are listed, but only abnormal results are displayed)  Labs Reviewed  BASIC METABOLIC PANEL  CBC  TROPONIN I   ____________________________________________  EKG  ED ECG REPORT I, Jene Everyobert Marlane Hirschmann, the attending physician, personally viewed and interpreted this ECG.  Date: 05/21/2018  Rhythm: Sinus tachycardia QRS Axis: normal Intervals: normal ST/T Wave abnormalities: normal Narrative Interpretation: Pulmonary disease pattern  ____________________________________________  RADIOLOGY  Chest x-ray negative for pneumonia or pneumothorax ____________________________________________   PROCEDURES  Procedure(s) performed: No  Procedures   Critical Care performed: No ____________________________________________   INITIAL IMPRESSION / ASSESSMENT AND PLAN / ED COURSE  Pertinent labs & imaging results that were available during my care of the patient  were reviewed by me and considered in my medical decision making (see chart for details).  Patient presents with shortness of breath and chest tightness.  He is tachycardic with wheezing, consistent with COPD exacerbation.  No evidence of ischemia on EKG, labs unremarkable.  Chest x-ray negative for pneumothorax or pneumonia.  Will treat with IV Solu-Medrol, multiple duo nebs and reevaluate  Have asked Dr. Pershing ProudSchaevitz to re-evaluate the patient    ____________________________________________   FINAL CLINICAL IMPRESSION(S) / ED DIAGNOSES  Final diagnoses:  COPD exacerbation (HCC)        Note:  This document was prepared using Dragon voice recognition software and may include unintentional dictation errors.    Jene EveryKinner, Ajanee Buren, MD 05/21/18 [phone]

## 2018-05-21 NOTE — ED Triage Notes (Signed)
Patient presents to the ED with increased shortness of breath and chest pain since yesterday evening.  Patient reports history of copd and states he is out of his inhalers.  Patient has substernal retractions and tachypnea in triage.  Patient reports he is supposed to "have a breathing machine at home" but his PCP hasn't gotten it "set up yet."

## 2018-05-21 NOTE — ED Provider Notes (Signed)
Signout from Dr. Cyril LoosenKinner in this 42 year old male with a history of COPD, out of his inhalers, who is presenting with what appears to be a COPD exacerbation.  Plan is to reassess after he has received nebs as well as steroids.  Physical Exam  BP 103/79 (BP Location: Right Arm)   Pulse (!) 116   Temp 98.2 F (36.8 C) (Oral)   Resp 20   Ht 5\' 11"  (1.803 m)   Wt 65.8 kg (145 lb)   SpO2 96%   BMI 20.22 kg/m  ----------------------------------------- 4:33 PM on 05/21/2018 -----------------------------------------  Patient at this time says he feels improved. Physical Exam No respiratory distress.  No retractions.  Lungs are clear throughout all fields.  Heart rate still elevated to 1 15-1 18 but at this point is likely related to use of beta agonists. ED Course/Procedures     Procedures  MDM  Patient will be discharged home.  Feels improved and exam improved from what was reported on Dr. Fransisca ConnorsKinner's note.  Patient will be discharged with an inhaler as well as prednisone and a Z-Pak.  Patient is understanding of the treatment plan willing to comply.       Myrna BlazerSchaevitz, David Matthew, MD 05/21/18 [phone]

## 2018-06-20 ENCOUNTER — Encounter: Payer: Self-pay | Admitting: Emergency Medicine

## 2018-06-20 ENCOUNTER — Emergency Department: Payer: Medicare Other

## 2018-06-20 ENCOUNTER — Emergency Department
Admission: EM | Admit: 2018-06-20 | Discharge: 2018-06-20 | Disposition: A | Discharge status: Home / Self Care | Payer: Medicare Other | Attending: Emergency Medicine | Admitting: Emergency Medicine

## 2018-06-20 ENCOUNTER — Other Ambulatory Visit: Payer: Self-pay

## 2018-06-20 DIAGNOSIS — Z79899 Other long term (current) drug therapy: Secondary | ICD-10-CM | POA: Diagnosis not present

## 2018-06-20 DIAGNOSIS — J449 Chronic obstructive pulmonary disease, unspecified: Secondary | ICD-10-CM | POA: Insufficient documentation

## 2018-06-20 DIAGNOSIS — Z87891 Personal history of nicotine dependence: Secondary | ICD-10-CM | POA: Insufficient documentation

## 2018-06-20 DIAGNOSIS — R109 Unspecified abdominal pain: Secondary | ICD-10-CM

## 2018-06-20 DIAGNOSIS — F1410 Cocaine abuse, uncomplicated: Secondary | ICD-10-CM | POA: Insufficient documentation

## 2018-06-20 DIAGNOSIS — F4325 Adjustment disorder with mixed disturbance of emotions and conduct: Secondary | ICD-10-CM | POA: Diagnosis not present

## 2018-06-20 DIAGNOSIS — F419 Anxiety disorder, unspecified: Secondary | ICD-10-CM | POA: Diagnosis not present

## 2018-06-20 DIAGNOSIS — F319 Bipolar disorder, unspecified: Secondary | ICD-10-CM | POA: Diagnosis not present

## 2018-06-20 DIAGNOSIS — I509 Heart failure, unspecified: Secondary | ICD-10-CM | POA: Insufficient documentation

## 2018-06-20 DIAGNOSIS — R1032 Left lower quadrant pain: Secondary | ICD-10-CM | POA: Diagnosis present

## 2018-06-20 LAB — COMPREHENSIVE METABOLIC PANEL
ALBUMIN: 4.2 g/dL (ref 3.5–5.0)
ALK PHOS: 64 U/L (ref 38–126)
ALT: 36 U/L (ref 0–44)
ANION GAP: 6 (ref 5–15)
AST: 26 U/L (ref 15–41)
BUN: 14 mg/dL (ref 6–20)
CALCIUM: 9.9 mg/dL (ref 8.9–10.3)
CHLORIDE: 104 mmol/L (ref 98–111)
CO2: 31 mmol/L (ref 22–32)
Creatinine, Ser: 1.15 mg/dL (ref 0.61–1.24)
GFR calc Af Amer: >60 mL/min (ref >60)
GFR calc non Af Amer: >60 mL/min (ref >60)
GLUCOSE: 82 mg/dL (ref 70–99)
Potassium: 4.7 mmol/L (ref 3.5–5.1)
SODIUM: 141 mmol/L (ref 135–145)
Total Bilirubin: 0.7 mg/dL (ref 0.3–1.2)
Total Protein: 7.6 g/dL (ref 6.5–8.1)

## 2018-06-20 LAB — URINALYSIS, COMPLETE (UACMP) WITH MICROSCOPIC
BACTERIA UA: NONE SEEN
Bilirubin Urine: NEGATIVE
GLUCOSE, UA: NEGATIVE mg/dL
Hgb urine dipstick: NEGATIVE
Ketones, ur: NEGATIVE mg/dL
Leukocytes, UA: NEGATIVE
Nitrite: NEGATIVE
PROTEIN: NEGATIVE mg/dL
Specific Gravity, Urine: 1.018 (ref 1.005–1.030)
pH: 6 (ref 5.0–8.0)

## 2018-06-20 LAB — CBC
HCT: 49.3 % (ref 40.0–52.0)
Hemoglobin: 16.9 g/dL (ref 13.0–18.0)
MCH: 30.2 pg (ref 26.0–34.0)
MCHC: 34.4 g/dL (ref 32.0–36.0)
MCV: 87.9 fL (ref 80.0–100.0)
Platelets: 271 10*3/uL (ref 150–440)
RBC: 5.6 MIL/uL (ref 4.40–5.90)
RDW: 13.6 % (ref 11.5–14.5)
WBC: 5.2 10*3/uL (ref 3.8–10.6)

## 2018-06-20 LAB — LIPASE, BLOOD: LIPASE: 45 U/L (ref 11–51)

## 2018-06-20 MED ORDER — OXYCODONE-ACETAMINOPHEN 5-325 MG PO TABS: 1.0000 | ORAL_TABLET | ORAL | 0 refills | Status: DC | PRN

## 2018-06-20 MED ORDER — KETOROLAC TROMETHAMINE 60 MG/2ML IM SOLN
60.0000 mg | Freq: Once | INTRAMUSCULAR | Status: AC
  Administered 2018-06-20: 60 mg via INTRAMUSCULAR
  Filled 2018-06-20: qty 2

## 2018-06-20 MED ORDER — ALBUTEROL SULFATE HFA 108 (90 BASE) MCG/ACT IN AERS: 2.0000 | INHALATION_SPRAY | Freq: Four times a day (QID) | RESPIRATORY_TRACT | 0 refills | Status: DC | PRN

## 2018-06-20 MED ORDER — OXYCODONE-ACETAMINOPHEN 5-325 MG PO TABS
2.0000 | ORAL_TABLET | Freq: Once | ORAL | Status: AC
  Administered 2018-06-20: 2 via ORAL
  Filled 2018-06-20: qty 2

## 2018-06-20 MED ORDER — TAMSULOSIN HCL 0.4 MG PO CAPS: 0.4000 mg | ORAL_CAPSULE | Freq: Every day | ORAL | 0 refills | Status: DC

## 2018-06-20 NOTE — ED Provider Notes (Signed)
[facility] Emergency Department Provider Note  Time seen: 11:25 AM  I have reviewed the triage vital signs and the nursing notes.   HISTORY  Chief Complaint Back Pain    HPI Reginald Hopkins is a 42 y.o. male with a past medical history of chronic back pain, COPD, bipolar, kidney stones, presents to the emergency department for left flank pain.  According to the patient for the past 3 days he has had a fairly constant dull pain in his left lower quadrant radiating around to his left mid back.  Denies any dysuria or hematuria, no diarrhea, black or bloody stool.  Denies any fever.  Currently describes the pain as moderate.  Denies nausea or vomiting.   Past Medical History:  Diagnosis Date  . Anxiety   . Back pain   . CHF (congestive heart failure) (HCC)   . COPD (chronic obstructive pulmonary disease) (HCC)   . PTSD (post-traumatic stress disorder)     Patient Active Problem List   Diagnosis Date Noted  . COPD exacerbation (HCC) 02/10/2018  . 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 Explosive personality disorder (HCC)   . Bipolar 2 disorder, major depressive episode (HCC)   . GAD (generalized anxiety disorder)   . Substance induced mood disorder (HCC) 03/24/2016  . Cocaine abuse (HCC) 03/24/2016  . PTSD (post-traumatic stress disorder) 03/24/2016  . COPD (chronic obstructive pulmonary disease) (HCC) 03/24/2016  . Antisocial personality disorder (HCC) 03/24/2016    Past Surgical History:  Procedure Laterality Date  . BACK SURGERY      Prior to Admission medications   Medication Sig Start Date End Date Taking? Authorizing Provider  albuterol (PROVENTIL HFA;VENTOLIN HFA) 108 (90 Base) MCG/ACT inhaler Inhale 2 puffs into the lungs every 6 (six) hours as needed. 05/21/18   Schaevitz, Myra Rude, MD  clonazePAM (KLONOPIN) 0.5 MG tablet Take 0.5 mg by mouth 3 (three) times daily.  11/22/17   [provider]  guaiFENesin-dextromethorphan (ROBITUSSIN DM) 100-10 MG/5ML syrup Take 5 mLs by mouth every 4 (four) hours as needed for cough. 02/10/18   Milagros Loll, MD  ibuprofen (ADVIL,MOTRIN) 800 MG tablet Take 800 mg by mouth 3 (three) times daily. 01/07/18   [provider]  ipratropium-albuterol (DUONEB) 0.5-2.5 (3) MG/3ML SOLN Take 3 mLs by nebulization every 6 (six) hours as needed. 02/10/18   Milagros Loll, MD  lamoTRIgine (LAMICTAL) 25 MG tablet Take 2 tablets (50 mg total) by mouth daily. Patient not taking: Reported on 02/10/2018 03/26/16   Withrow, Everardo All, FNP  loxapine (LOXITANE) 10 MG capsule Take 20 mg by mouth at bedtime. 01/04/18   [provider]  LYRICA 200 MG capsule Take 100 mg by mouth 2 (two) times daily.  11/22/17   [provider]  predniSONE (DELTASONE) 20 MG tablet Take 2 tablets (40 mg total) by mouth daily. 02/10/18   Milagros Loll, MD  predniSONE (DELTASONE) 50 MG tablet Take 1 tab, PO Daily x5 days 05/21/18   Myrna Blazer, MD  QUEtiapine (SEROQUEL) 200 MG tablet Take 1 tablet (200 mg total) by mouth at bedtime. 03/25/16   Withrow, Everardo All, FNP  Respiratory Therapy Supplies (NEBULIZER) DEVI Nebulizer machine and supplies 02/10/18   Milagros Loll, MD    No Known Allergies  No family history on file.  Social History Social History   Tobacco Use  . Smoking status: Former Games developer  . Smokeless tobacco: Never Used  Substance Use Topics  .  Alcohol use: Yes  . Drug use: Yes    Types: Cocaine, Marijuana    Review of Systems Constitutional: Negative for fever. Eyes: Negative for visual complaints ENT: Negative for recent illness/congestion Cardiovascular: Negative for chest pain. Respiratory: Negative for shortness of breath. Gastrointestinal: Eft lower quadrant abdominal pain radiating to the back.  Negative for nausea vomiting diarrhea Genitourinary: Negative for hematuria or dysuria Musculoskeletal: Left lower back pain. Skin:  Negative for skin complaints  Neurological: Negative for headache All other ROS negative  ____________________________________________   PHYSICAL EXAM:  VITAL SIGNS: ED Triage Vitals  Enc Vitals Group     BP 06/20/18 1005 129/82     Pulse Rate 06/20/18 1005 95     Resp 06/20/18 1005 20     Temp 06/20/18 1005 97.8 F (36.6 C)     Temp Source 06/20/18 1005 Oral     SpO2 06/20/18 1005 99 %     Weight 06/20/18 0942 145 lb (65.8 kg)     Height 06/20/18 0942 5\' 11"  (1.803 m)     Head Circumference --      Peak Flow --      Pain Score 06/20/18 0941 8     Pain Loc --      Pain Edu? --      Excl. in GC? --     Constitutional: Alert and oriented. Well appearing and in no distress. Eyes: Normal exam ENT   Head: Normocephalic and atraumatic   Mouth/Throat: Mucous membranes are moist. Cardiovascular: Normal rate, regular rhythm. No murmur Respiratory: Normal respiratory effort without tachypnea nor retractions. Breath sounds are clear  Gastrointestinal: Mild left lower quadrant tenderness to palpation abdomen otherwise benign without distention rebound or guarding.  Slight left CVA tenderness. Musculoskeletal: Nontender with normal range of motion in all extremities. Neurologic:  Normal speech and language. No gross focal neurologic deficits  Skin:  Skin is warm, dry and intact.  Psychiatric: Mood and affect are normal.   ____________________________________________   RADIOLOGY  ET scan largely negative, he does have left-sided renal stones but no ureteral stones.  ____________________________________________   INITIAL IMPRESSION / ASSESSMENT AND PLAN / ED COURSE  Pertinent labs & imaging results that were available during my care of the patient were reviewed by me and considered in my medical decision making (see chart for details).  Patient presents to the emergency department for left lower quadrant/left flank pain for the past 3 days.  Differential would include  ureterolithiasis, urinary tract infection, pyelonephritis, colitis or diverticulitis, atypical appendicitis.  Patient's labs are reassuring including a normal white blood cell count, normal kidney function.  Patient's urinalysis does show 6-10 red blood cells otherwise negative.  I have added on a urine culture.  We will proceed with CT renal scan to further evaluate.  At this time highly suspect ureterolithiasis.  We will dose Toradol for pain control while awaiting CT results.  Patient with multiple left renal stones but no obvious ureteral stones.  CT scan otherwise negative.  Patient does have blood within the urine possible recently passed stone or small stone.  We will discharge with a short course of pain medication and Flomax.  Patient will follow-up with his doctor.  He also is requesting an albuterol inhaler as he is currently out of his medication.  ____________________________________________   FINAL CLINICAL IMPRESSION(S) / ED DIAGNOSES  Left flank pain    Minna AntisPaduchowski, Syniyah Bourne, MD 06/20/18 1242

## 2018-06-20 NOTE — ED Triage Notes (Signed)
Says low back pain.  Says he thinks kidney stone or uti.  Shooting throbbing pain for 3 days.

## 2018-06-21 LAB — URINE CULTURE: Culture: NO GROWTH

## 2018-09-12 ENCOUNTER — Emergency Department: Payer: Medicare Other

## 2018-09-12 ENCOUNTER — Inpatient Hospital Stay
Admission: EM | Admit: 2018-09-12 | Discharge: 2018-09-15 | DRG: 190 | Disposition: A | Discharge status: Home / Self Care | Payer: Medicare Other | Attending: Internal Medicine | Admitting: Internal Medicine

## 2018-09-12 DIAGNOSIS — Z8249 Family history of ischemic heart disease and other diseases of the circulatory system: Secondary | ICD-10-CM

## 2018-09-12 DIAGNOSIS — F411 Generalized anxiety disorder: Secondary | ICD-10-CM | POA: Diagnosis present

## 2018-09-12 DIAGNOSIS — J9601 Acute respiratory failure with hypoxia: Secondary | ICD-10-CM

## 2018-09-12 DIAGNOSIS — J9600 Acute respiratory failure, unspecified whether with hypoxia or hypercapnia: Secondary | ICD-10-CM | POA: Diagnosis present

## 2018-09-12 DIAGNOSIS — Z7151 Drug abuse counseling and surveillance of drug abuser: Secondary | ICD-10-CM

## 2018-09-12 DIAGNOSIS — F602 Antisocial personality disorder: Secondary | ICD-10-CM | POA: Diagnosis present

## 2018-09-12 DIAGNOSIS — Z87891 Personal history of nicotine dependence: Secondary | ICD-10-CM | POA: Diagnosis not present

## 2018-09-12 DIAGNOSIS — J441 Chronic obstructive pulmonary disease with (acute) exacerbation: Secondary | ICD-10-CM | POA: Diagnosis present

## 2018-09-12 DIAGNOSIS — F4325 Adjustment disorder with mixed disturbance of emotions and conduct: Secondary | ICD-10-CM | POA: Diagnosis present

## 2018-09-12 DIAGNOSIS — Y636 Underdosing and nonadministration of necessary drug, medicament or biological substance: Secondary | ICD-10-CM | POA: Diagnosis present

## 2018-09-12 DIAGNOSIS — Z7952 Long term (current) use of systemic steroids: Secondary | ICD-10-CM | POA: Diagnosis not present

## 2018-09-12 DIAGNOSIS — F1490 Cocaine use, unspecified, uncomplicated: Secondary | ICD-10-CM | POA: Diagnosis present

## 2018-09-12 DIAGNOSIS — F1510 Other stimulant abuse, uncomplicated: Secondary | ICD-10-CM

## 2018-09-12 DIAGNOSIS — F19239 Other psychoactive substance dependence with withdrawal, unspecified: Secondary | ICD-10-CM | POA: Diagnosis not present

## 2018-09-12 DIAGNOSIS — J9602 Acute respiratory failure with hypercapnia: Secondary | ICD-10-CM | POA: Diagnosis present

## 2018-09-12 DIAGNOSIS — Z79899 Other long term (current) drug therapy: Secondary | ICD-10-CM | POA: Diagnosis not present

## 2018-09-12 DIAGNOSIS — F070 Personality change due to known physiological condition: Secondary | ICD-10-CM | POA: Diagnosis present

## 2018-09-12 DIAGNOSIS — S0990XS Unspecified injury of head, sequela: Secondary | ICD-10-CM | POA: Diagnosis not present

## 2018-09-12 DIAGNOSIS — F3181 Bipolar II disorder: Secondary | ICD-10-CM | POA: Diagnosis present

## 2018-09-12 DIAGNOSIS — Z9119 Patient's noncompliance with other medical treatment and regimen: Secondary | ICD-10-CM

## 2018-09-12 DIAGNOSIS — F4310 Post-traumatic stress disorder, unspecified: Secondary | ICD-10-CM | POA: Diagnosis present

## 2018-09-12 DIAGNOSIS — F1910 Other psychoactive substance abuse, uncomplicated: Secondary | ICD-10-CM | POA: Diagnosis not present

## 2018-09-12 DIAGNOSIS — F1110 Opioid abuse, uncomplicated: Secondary | ICD-10-CM | POA: Diagnosis present

## 2018-09-12 DIAGNOSIS — F603 Borderline personality disorder: Secondary | ICD-10-CM | POA: Diagnosis present

## 2018-09-12 LAB — URINE DRUG SCREEN, QUALITATIVE (ARMC ONLY)
Amphetamines, Ur Screen: POSITIVE — AB
BARBITURATES, UR SCREEN: NOT DETECTED
BENZODIAZEPINE, UR SCRN: POSITIVE — AB
CANNABINOID 50 NG, UR ~~LOC~~: NOT DETECTED
Cocaine Metabolite,Ur ~~LOC~~: POSITIVE — AB
MDMA (Ecstasy)Ur Screen: NOT DETECTED
METHADONE SCREEN, URINE: NOT DETECTED
Opiate, Ur Screen: POSITIVE — AB
PHENCYCLIDINE (PCP) UR S: NOT DETECTED
Tricyclic, Ur Screen: NOT DETECTED

## 2018-09-12 LAB — BLOOD GAS, ARTERIAL
ACID-BASE DEFICIT: 2.5 mmol/L — AB (ref 0.0–2.0)
BICARBONATE: 24.6 mmol/L (ref 20.0–28.0)
DELIVERY SYSTEMS: POSITIVE
Expiratory PAP: 5
FIO2: 1
Inspiratory PAP: 15
O2 SAT: 100 %
PATIENT TEMPERATURE: 37
PCO2 ART: 50 mmHg — AB (ref 32.0–48.0)
PH ART: 7.3 — AB (ref 7.350–7.450)
pO2, Arterial: 548 mmHg — ABNORMAL HIGH (ref 83.0–108.0)

## 2018-09-12 LAB — CBC WITH DIFFERENTIAL/PLATELET
BASOS ABS: 0 10*3/uL (ref 0–0.1)
BASOS PCT: 1 %
Eosinophils Absolute: 0.3 10*3/uL (ref 0–0.7)
Eosinophils Relative: 3 %
HEMATOCRIT: 43.7 % (ref 40.0–52.0)
Hemoglobin: 15 g/dL (ref 13.0–18.0)
Lymphocytes Relative: 23 %
Lymphs Abs: 1.8 10*3/uL (ref 1.0–3.6)
MCH: 30.3 pg (ref 26.0–34.0)
MCHC: 34.3 g/dL (ref 32.0–36.0)
MCV: 88.5 fL (ref 80.0–100.0)
Monocytes Absolute: 0.8 10*3/uL (ref 0.2–1.0)
Monocytes Relative: 11 %
NEUTROS ABS: 4.7 10*3/uL (ref 1.4–6.5)
Neutrophils Relative %: 62 %
PLATELETS: 218 10*3/uL (ref 150–440)
RBC: 4.94 MIL/uL (ref 4.40–5.90)
RDW: 13.8 % (ref 11.5–14.5)
WBC: 7.6 10*3/uL (ref 3.8–10.6)

## 2018-09-12 LAB — COMPREHENSIVE METABOLIC PANEL
ALBUMIN: 4.3 g/dL (ref 3.5–5.0)
ALK PHOS: 56 U/L (ref 38–126)
ALT: 68 U/L — AB (ref 0–44)
AST: 46 U/L — AB (ref 15–41)
Anion gap: 9 (ref 5–15)
BUN: 27 mg/dL — AB (ref 6–20)
CALCIUM: 8.7 mg/dL — AB (ref 8.9–10.3)
CO2: 27 mmol/L (ref 22–32)
CREATININE: 1.1 mg/dL (ref 0.61–1.24)
Chloride: 104 mmol/L (ref 98–111)
GFR calc Af Amer: >60 mL/min (ref >60)
GFR calc non Af Amer: >60 mL/min (ref >60)
GLUCOSE: 137 mg/dL — AB (ref 70–99)
Potassium: 3.7 mmol/L (ref 3.5–5.1)
SODIUM: 140 mmol/L (ref 135–145)
Total Bilirubin: 1.8 mg/dL — ABNORMAL HIGH (ref 0.3–1.2)
Total Protein: 7.3 g/dL (ref 6.5–8.1)

## 2018-09-12 LAB — TROPONIN I: Troponin I: <0.03 ng/mL (ref <0.03)

## 2018-09-12 LAB — MRSA PCR SCREENING: MRSA by PCR: NEGATIVE

## 2018-09-12 LAB — ACETAMINOPHEN LEVEL

## 2018-09-12 LAB — SALICYLATE LEVEL

## 2018-09-12 LAB — BRAIN NATRIURETIC PEPTIDE: B Natriuretic Peptide: 20 pg/mL (ref 0.0–100.0)

## 2018-09-12 LAB — ETHANOL: Alcohol, Ethyl (B): <10 mg/dL (ref <10)

## 2018-09-12 LAB — LIPASE, BLOOD: Lipase: 21 U/L (ref 11–51)

## 2018-09-12 LAB — GLUCOSE, CAPILLARY: GLUCOSE-CAPILLARY: 103 mg/dL — AB (ref 70–99)

## 2018-09-12 MED ORDER — HEPARIN SODIUM (PORCINE) 5000 UNIT/ML IJ SOLN
5000.0000 [IU] | Freq: Three times a day (TID) | INTRAMUSCULAR | Status: DC
  Administered 2018-09-13 (×2): 5000 [IU] via SUBCUTANEOUS
  Filled 2018-09-12 (×5): qty 1

## 2018-09-12 MED ORDER — LORAZEPAM 1 MG PO TABS: 1.0000 mg | ORAL_TABLET | Freq: Two times a day (BID) | ORAL | Status: DC

## 2018-09-12 MED ORDER — DOCUSATE SODIUM 100 MG PO CAPS
100.0000 mg | ORAL_CAPSULE | Freq: Two times a day (BID) | ORAL | Status: DC | PRN
  Administered 2018-09-15: 100 mg via ORAL
  Filled 2018-09-12: qty 1

## 2018-09-12 MED ORDER — PREDNISONE 20 MG PO TABS
40.0000 mg | ORAL_TABLET | Freq: Every day | ORAL | Status: DC
  Administered 2018-09-13: 40 mg via ORAL
  Filled 2018-09-12: qty 2

## 2018-09-12 MED ORDER — SODIUM CHLORIDE 0.9 % IV BOLUS
1000.0000 mL | Freq: Once | INTRAVENOUS | Status: AC
  Administered 2018-09-12: 1000 mL via INTRAVENOUS

## 2018-09-12 MED ORDER — OXYCODONE-ACETAMINOPHEN 5-325 MG PO TABS
1.0000 | ORAL_TABLET | ORAL | Status: DC | PRN
  Administered 2018-09-12 (×3): 1 via ORAL
  Filled 2018-09-12 (×3): qty 1

## 2018-09-12 MED ORDER — ALBUTEROL SULFATE (2.5 MG/3ML) 0.083% IN NEBU
5.0000 mg/h | INHALATION_SOLUTION | Freq: Once | RESPIRATORY_TRACT | Status: AC
  Administered 2018-09-12: 5 mg/h via RESPIRATORY_TRACT
  Filled 2018-09-12: qty 3

## 2018-09-12 MED ORDER — MAGNESIUM SULFATE 2 GM/50ML IV SOLN
2.0000 g | Freq: Once | INTRAVENOUS | Status: AC
  Administered 2018-09-12: 2 g via INTRAVENOUS
  Filled 2018-09-12: qty 50

## 2018-09-12 MED ORDER — TAMSULOSIN HCL 0.4 MG PO CAPS
0.4000 mg | ORAL_CAPSULE | Freq: Every day | ORAL | Status: DC
  Administered 2018-09-12 – 2018-09-15 (×3): 0.4 mg via ORAL
  Filled 2018-09-12 (×3): qty 1

## 2018-09-12 MED ORDER — LORAZEPAM 0.5 MG PO TABS
0.5000 mg | ORAL_TABLET | Freq: Every day | ORAL | Status: DC
  Administered 2018-09-12 – 2018-09-13 (×2): 0.5 mg via ORAL
  Filled 2018-09-12 (×2): qty 1

## 2018-09-12 MED ORDER — IPRATROPIUM-ALBUTEROL 0.5-2.5 (3) MG/3ML IN SOLN
RESPIRATORY_TRACT | Status: AC
  Administered 2018-09-12: 06:00:00
  Filled 2018-09-12: qty 6

## 2018-09-12 MED ORDER — IPRATROPIUM-ALBUTEROL 0.5-2.5 (3) MG/3ML IN SOLN: 3.0000 mL | RESPIRATORY_TRACT | Status: DC

## 2018-09-12 MED ORDER — LORAZEPAM 0.5 MG PO TABS: 0.5000 mg | ORAL_TABLET | Freq: Three times a day (TID) | ORAL | Status: DC

## 2018-09-12 MED ORDER — QUETIAPINE FUMARATE 100 MG PO TABS
200.0000 mg | ORAL_TABLET | Freq: Every day | ORAL | Status: DC
  Administered 2018-09-13: 200 mg via ORAL
  Filled 2018-09-12: qty 8
  Filled 2018-09-12 (×3): qty 2
  Filled 2018-09-12: qty 8
  Filled 2018-09-12: qty 2

## 2018-09-12 MED ORDER — METHYLPREDNISOLONE SODIUM SUCC 125 MG IJ SOLR
60.0000 mg | Freq: Four times a day (QID) | INTRAMUSCULAR | Status: DC
  Administered 2018-09-12: 60 mg via INTRAVENOUS
  Filled 2018-09-12: qty 2

## 2018-09-12 MED ORDER — BUDESONIDE 0.25 MG/2ML IN SUSP
0.2500 mg | Freq: Two times a day (BID) | RESPIRATORY_TRACT | Status: DC
  Administered 2018-09-12: 0.25 mg via RESPIRATORY_TRACT
  Filled 2018-09-12: qty 2

## 2018-09-12 MED ORDER — LORAZEPAM 2 MG/ML IJ SOLN
1.0000 mg | Freq: Once | INTRAMUSCULAR | Status: AC
  Administered 2018-09-12: 1 mg via INTRAVENOUS
  Filled 2018-09-12: qty 1

## 2018-09-12 MED ORDER — ALBUTEROL SULFATE (2.5 MG/3ML) 0.083% IN NEBU
INHALATION_SOLUTION | RESPIRATORY_TRACT | Status: AC
  Filled 2018-09-12: qty 3

## 2018-09-12 MED ORDER — MOMETASONE FURO-FORMOTEROL FUM 100-5 MCG/ACT IN AERO
2.0000 | INHALATION_SPRAY | Freq: Two times a day (BID) | RESPIRATORY_TRACT | Status: DC
  Administered 2018-09-12 – 2018-09-15 (×7): 2 via RESPIRATORY_TRACT
  Filled 2018-09-12: qty 8.8

## 2018-09-12 MED ORDER — PREGABALIN 75 MG PO CAPS
300.0000 mg | ORAL_CAPSULE | Freq: Two times a day (BID) | ORAL | Status: DC
  Administered 2018-09-12 – 2018-09-15 (×6): 300 mg via ORAL
  Filled 2018-09-12 (×6): qty 4

## 2018-09-12 MED ORDER — LORAZEPAM 1 MG PO TABS
1.0000 mg | ORAL_TABLET | Freq: Two times a day (BID) | ORAL | Status: DC
  Administered 2018-09-12 – 2018-09-13 (×3): 1 mg via ORAL
  Filled 2018-09-12 (×3): qty 1

## 2018-09-12 MED ORDER — OXYCODONE-ACETAMINOPHEN 5-325 MG PO TABS
ORAL_TABLET | ORAL | Status: AC
  Administered 2018-09-12: 1
  Filled 2018-09-12: qty 1

## 2018-09-12 MED ORDER — TIOTROPIUM BROMIDE MONOHYDRATE 18 MCG IN CAPS
18.0000 ug | ORAL_CAPSULE | Freq: Every morning | RESPIRATORY_TRACT | Status: DC
  Administered 2018-09-12 – 2018-09-15 (×4): 18 ug via RESPIRATORY_TRACT
  Filled 2018-09-12: qty 5

## 2018-09-12 NOTE — ED Notes (Signed)
Report given to Taylor RN.

## 2018-09-12 NOTE — Plan of Care (Signed)
  Problem: Clinical Measurements: Goal: Ability to maintain clinical measurements within normal limits will improve Outcome: Not Progressing Note:  BUN level today is elevated at 27. Will continue to monitor renal status. Jari Favre [facility]

## 2018-09-12 NOTE — Consult Note (Signed)
   CHIEF COMPLAINT:  No chief complaint on file.   Subjective  Admitted for COPD Exacerbation Ran of out NEB therapy Snorting METH Patient was placed on biPAP Weaned off biPAP upon arrival to unit Alert and awake Follows commands No resp distress  Plan to transfer to gen med floor Patient does NOT meet ICU or SD criteria     Objective   Review of Systems:  Gen:  Denies  fever, sweats, chills weigh loss  HEENT: Denies blurred vision, double vision, ear pain, eye pain, hearing loss, nose bleeds, sore throat Cardiac:  No dizziness, chest pain or heaviness, chest tightness,edema, No JVD Resp:   +cough  +shortness of breath,+wheezing, -hemoptysis,  Gi: Denies swallowing difficulty, stomach pain, nausea or vomiting, diarrhea, constipation, bowel incontinence Gu:  Denies bladder incontinence, burning urine Ext:   Denies Joint pain, stiffness or swelling Skin: Denies  skin rash, easy bruising or bleeding or hives Endoc:  Denies polyuria, polydipsia , polyphagia or weight change Psych:   Denies depression, insomnia or hallucinations  Other:  All other systems negative  Examination:  General exam: Appears calm and comfortable  Respiratory system: Clear to auscultation. Respiratory effort normal. +wheezing HEENT: [place]/AT, PERRLA, no thrush, no stridor. Cardiovascular system: S1 & S2 heard, RRR. No JVD, murmurs, rubs, gallops or clicks. No pedal edema. Gastrointestinal system: Abdomen is nondistended, soft and nontender. No organomegaly or masses felt. Normal bowel sounds heard. Central nervous system: Alert and oriented. No focal neurological deficits. Extremities: Symmetric 5 x 5 power. Skin: No rashes, lesions or ulcers Psychiatry: Judgement and insight appear normal. Mood & affect appropriate.   VITALS:  height is 5\' 11"  (1.803 m) and weight is 66.3 kg. His oral temperature is 97.6 F (36.4 C). His blood pressure is 125/72 and his pulse is 101 (abnormal). His respiration is 17  and oxygen saturation is 100%.   I personally reviewed Labs under Results section.  Radiology Reports Dg Chest Portable 1 View  Result Date: 09/12/2018 CLINICAL DATA:  Awoke with respiratory distress. EXAM: PORTABLE CHEST 1 VIEW COMPARISON:  05/21/2018 FINDINGS: Chronic hyperinflation. No focal airspace disease. Normal heart size and mediastinal contours. No pulmonary edema, pneumothorax, or pleural effusion. No acute osseous abnormalities are seen. IMPRESSION: Chronic hyperinflation without acute finding. Electronically Signed   By: Narda Rutherford M.D.   On: 09/12/2018 06:14       Assessment/Plan:  Acute COPD exacerbation from noncompliance of NEBS and drug abuse resolving  1.oxygen as needed 2.prednisone 40 mg daily 3.start Dulera/Spiriva 4.transfer to floor    Lucie Leather, M.D.  [facility]  Medical Director [facility] Medical Director [facility] Cardio-Pulmonary Department

## 2018-09-12 NOTE — ED Triage Notes (Signed)
Pt arrived from home via EMS with complaints of respiratory distress. Pt was asleep and when he woke up he felt like he couldn't breath. Pt has Hx of COPD. Pt states that he did Meth tonight "to open up his lungs". Pt states that he is not a smoker. EMS states that he was belly breathing and had chest retractions upon arrival. EMS gave 1 Duoneb, 2 Albuterol treatments, 125mg  of Solumedrol, and 2g of Magnesium Sulfate en route to hospital. Pt is alert and oriented x 4. VS per EMS BP-128/96 HR-102 O2sat-86%RA before placed on oxygen. EMS placed a 20 gauge in the right arm. Dr. York Cerise at bedside.

## 2018-09-12 NOTE — Care Management (Signed)
Patient verbally confirmed that he does not have a pcp.  He declines CM offer to obtain an appointment for him.  Says " give me a list.  I will do it myself."  CM asked how he was filling his inhalers and neb solutions without a physician and he stated his psychiatrist orders the.  Says the last time he went to fill neb solution the cost was 100 dollar and could not afford it.  He denies issues with transportation.  Says he is followed by [facility] and [facility].  He currently resides at [facility].  He verbally confirms he has medicare and medicaid. Patient uses [facility] in [place].  CM contact this pharmacy and informed that the ipratropium/albuterol that was ordered is not covered under patient's patient's part d plan. It will cover ipratropium  .02% solution and will also cover albuterol .083% solution so would need scripts.  Patient declines to discuss his income with CM as "I have trust issues and I do not know." Gave patient contact information for local physicians

## 2018-09-12 NOTE — H&P (Signed)
[facility] [place] [facility]   PATIENT NAME: Reginald Hopkins    MR#:  [id number]  DATE OF BIRTH:  1976/08/23  DATE OF ADMISSION:  09/12/2018  PRIMARY CARE PHYSICIAN: Patient, No Pcp Per   REQUESTING/REFERRING PHYSICIAN: York Cerise  CHIEF COMPLAINT:  No chief complaint on file.   HISTORY OF PRESENT ILLNESS: Reginald Hopkins  is a 42 y.o. male with a known history of COPD, CHF, posttraumatic stress disorder, drug abuse-and out of his inhalers and COPD medications for last few days and was feeling very short of breath last night.  He tried snorting methamphetamine, but it did not help and patient continued to feel significantly short of breath so finally called EMS and came to emergency room. On arrival he was noted to have oxygen saturation up to 85% with minimal air movement on his lung exam so ER physician started on BiPAP after giving steroid injections and nebulizer therapy and called hospitalist service for admission. During my exam patient appeared comfortable on BiPAP, denied any fever or cough or sputum production for last few days.  PAST MEDICAL HISTORY:   Past Medical History:  Diagnosis Date  . Anxiety   . Back pain   . CHF (congestive heart failure) (HCC)   . COPD (chronic obstructive pulmonary disease) (HCC)   . PTSD (post-traumatic stress disorder)     PAST SURGICAL HISTORY:  Past Surgical History:  Procedure Laterality Date  . BACK SURGERY      SOCIAL HISTORY:  Social History   Tobacco Use  . Smoking status: Former Games developer  . Smokeless tobacco: Never Used  Substance Use Topics  . Alcohol use: Yes    FAMILY HISTORY:  Family History  Problem Relation Age of Onset  . Hypertension Father     DRUG ALLERGIES: No Known Allergies  REVIEW OF SYSTEMS:   CONSTITUTIONAL: No fever, fatigue or weakness.  EYES: No blurred or double vision.  EARS, NOSE, AND THROAT: No tinnitus or ear pain.  RESPIRATORY: No cough, he have shortness of breath, wheezing,  no hemoptysis.  CARDIOVASCULAR: No chest pain, orthopnea, edema.  GASTROINTESTINAL: No nausea, vomiting, diarrhea or abdominal pain.  GENITOURINARY: No dysuria, hematuria.  ENDOCRINE: No polyuria, nocturia,  HEMATOLOGY: No anemia, easy bruising or bleeding SKIN: No rash or lesion. MUSCULOSKELETAL: No joint pain or arthritis.   NEUROLOGIC: No tingling, numbness, weakness.  PSYCHIATRY: No anxiety or depression.   MEDICATIONS AT HOME:  Prior to Admission medications   Medication Sig Start Date End Date Taking? Authorizing Provider  clonazePAM (KLONOPIN) 0.5 MG tablet Take 0.5 mg by mouth 3 (three) times daily.   Yes [provider]  LYRICA 300 MG capsule Take 300 mg by mouth 2 (two) times daily. 05/26/18  Yes [provider]  Respiratory Therapy Supplies (NEBULIZER) DEVI Nebulizer machine and supplies 02/10/18  Yes Sudini, Wardell Heath, MD  albuterol (PROVENTIL HFA;VENTOLIN HFA) 108 (90 Base) MCG/ACT inhaler Inhale 2 puffs into the lungs every 6 (six) hours as needed for wheezing or shortness of breath. Patient not taking: Reported on 09/12/2018 06/20/18   Minna Antis, MD  ipratropium-albuterol (DUONEB) 0.5-2.5 (3) MG/3ML SOLN Take 3 mLs by nebulization every 6 (six) hours as needed. Patient not taking: Reported on 09/12/2018 02/10/18   Milagros Loll, MD  lamoTRIgine (LAMICTAL) 25 MG tablet Take 2 tablets (50 mg total) by mouth daily. Patient not taking: Reported on 02/10/2018 03/26/16   Beau Fanny, FNP  oxyCODONE-acetaminophen (PERCOCET) 5-325 MG tablet Take 1 tablet by mouth every  4 (four) hours as needed for severe pain. Patient not taking: Reported on 09/12/2018 06/20/18   Minna Antis, MD  QUEtiapine (SEROQUEL) 200 MG tablet Take 1 tablet (200 mg total) by mouth at bedtime. Patient not taking: Reported on 09/12/2018 03/25/16   Beau Fanny, FNP  tamsulosin (FLOMAX) 0.4 MG CAPS capsule Take 1 capsule (0.4 mg total) by mouth daily. Patient not taking: Reported on  09/12/2018 06/20/18   Minna Antis, MD      PHYSICAL EXAMINATION:   VITAL SIGNS: Blood pressure 117/77, pulse (!) 112, temperature 97.6 F (36.4 C), temperature source Oral, resp. rate (!) 21, height 5\' 11"  (1.803 m), weight 66.3 kg, SpO2 94 %.  GENERAL:  42 y.o.-year-old patient lying in the bed with no acute distress.  EYES: Pupils equal, round, reactive to light and accommodation. No scleral icterus. Extraocular muscles intact.  HEENT: Head atraumatic, normocephalic. Oropharynx and nasopharynx clear.  NECK:  Supple, no jugular venous distention. No thyroid enlargement, no tenderness.  LUNGS: Decreased breath sounds bilaterally, bilateral wheezing, no crepitation.  Positive for use of accessory muscles of respiration.  BiPAP and use CARDIOVASCULAR: S1, S2 normal. No murmurs, rubs, or gallops.  ABDOMEN: Soft, nontender, nondistended. Bowel sounds present. No organomegaly or mass.  EXTREMITIES: No pedal edema, cyanosis, or clubbing.  NEUROLOGIC: Cranial nerves II through XII are intact. Muscle strength 5/5 in all extremities. Sensation intact. Gait not checked.  PSYCHIATRIC: The patient is alert and oriented x 3.  SKIN: No obvious rash, lesion, or ulcer.  Multiple tattoos on the skin.  LABORATORY PANEL:   CBC Recent Labs  Lab 09/12/18 0554  WBC 7.6  HGB 15.0  HCT 43.7  PLT 218  MCV 88.5  MCH 30.3  MCHC 34.3  RDW 13.8  LYMPHSABS 1.8  MONOABS 0.8  EOSABS 0.3  BASOSABS 0.0   ------------------------------------------------------------------------------------------------------------------  Chemistries  Recent Labs  Lab 09/12/18 0554  NA 140  K 3.7  CL 104  CO2 27  GLUCOSE 137*  BUN 27*  CREATININE 1.10  CALCIUM 8.7*  AST 46*  ALT 68*  ALKPHOS 56  BILITOT 1.8*   ------------------------------------------------------------------------------------------------------------------ estimated creatinine clearance is 82 mL/min (by C-G formula based on SCr of 1.1  mg/dL). ------------------------------------------------------------------------------------------------------------------ No results for input(s): TSH, T4TOTAL, T3FREE, THYROIDAB in the last 72 hours.  Invalid input(s): FREET3   Coagulation profile No results for input(s): INR, PROTIME in the last 168 hours. ------------------------------------------------------------------------------------------------------------------- No results for input(s): DDIMER in the last 72 hours. -------------------------------------------------------------------------------------------------------------------  Cardiac Enzymes Recent Labs  Lab 09/12/18 0554  TROPONINI <0.03   ------------------------------------------------------------------------------------------------------------------ Invalid input(s): POCBNP  ---------------------------------------------------------------------------------------------------------------  Urinalysis    Component Value Date/Time   COLORURINE YELLOW (A) 06/20/2018 1006   APPEARANCEUR CLEAR (A) 06/20/2018 1006   APPEARANCEUR Clear 06/21/2014 2148   LABSPEC 1.018 06/20/2018 1006   LABSPEC 1.023 06/21/2014 2148   PHURINE 6.0 06/20/2018 1006   GLUCOSEU NEGATIVE 06/20/2018 1006   GLUCOSEU Negative 06/21/2014 2148   HGBUR NEGATIVE 06/20/2018 1006   BILIRUBINUR NEGATIVE 06/20/2018 1006   BILIRUBINUR Negative 06/21/2014 2148   KETONESUR NEGATIVE 06/20/2018 1006   PROTEINUR NEGATIVE 06/20/2018 1006   NITRITE NEGATIVE 06/20/2018 1006   LEUKOCYTESUR NEGATIVE 06/20/2018 1006   LEUKOCYTESUR Negative 06/21/2014 2148     RADIOLOGY: Dg Chest Portable 1 View  Result Date: 09/12/2018 CLINICAL DATA:  Awoke with respiratory distress. EXAM: PORTABLE CHEST 1 VIEW COMPARISON:  05/21/2018 FINDINGS: Chronic hyperinflation. No focal airspace disease. Normal heart size and mediastinal contours. No pulmonary edema, pneumothorax, or pleural  effusion. No acute osseous abnormalities  are seen. IMPRESSION: Chronic hyperinflation without acute finding. Electronically Signed   By: Narda Rutherford M.D.   On: 09/12/2018 06:14    EKG: Orders placed or performed during the hospital encounter of 09/12/18  . ED EKG  . EKG 12-Lead  . EKG 12-Lead  . ED EKG  . EKG 12-Lead  . EKG 12-Lead    IMPRESSION AND PLAN:  *Acute respiratory failure with hypoxia COPD exacerbation  IV any health steroids, nebulizer therapy, continue BiPAP use. Monitor on stepdown unit and get intensivist consult. Continue oxygen for now and try to taper as he can.  *Anxiety Give some Ativan.  *Post traumatic stress disorder Continue Seroquel.  *Multiple drug abuse Counseled to quit, currently no signs of withdrawal.  All the records are reviewed and case discussed with ED provider. Management plans discussed with the patient, family and they are in agreement.  CODE STATUS: Full.    Code Status Orders  (From admission, onward)         Start     Ordered   09/12/18 0906  Full code  Continuous     09/12/18 0905        Code Status History    Date Active Date Inactive Code Status Order ID Comments User Context   02/10/2018 0802 02/10/2018 1910 Full Code [id number]  Milagros Loll, MD ED   03/25/2016 0227 03/25/2016 2121 Full Code [id number]  Kerry Hough, PA-C Inpatient       TOTAL TIME TAKING CARE OF THIS PATIENT: 50 critical care minutes.    Altamese Dilling M.D on 09/12/2018   Between 7am to 6pm - Pager - [phone]  After 6pm go to [email or web address] - password [email or web address]  [facility]  Office  [phone]  CC: Primary care physician; Patient, No Pcp Per   Note: This dictation was prepared with Dragon dictation along with smaller phrase technology. Any transcriptional errors that result from this process are unintentional.

## 2018-09-12 NOTE — Care Management Note (Signed)
Case Management Note  Patient Details  Name: Noa Constante MRN: [id number] Date of Birth: 11/13/1976  Subjective/Objective:                 Admitted from home due to exac of COPD with  hypoxia and shortness of breath.  There is no documentation of a pcp. It is reported patient "ran out of inhalers and nebulizer meds." Admits to using methampedimines prior to presentation hoping to improve his symptoms.  Does have history of polysubstance abuse and has tested positive for cocanine on previous admissions. Urine drug screen is pending. No home oxygen. Has medicare and medicaid  Action/Plan:  Investigate need for pcp.  Expected Discharge Date:                  Expected Discharge Plan:     In-House Referral:     Discharge planning Services     Post Acute Care Choice:    Choice offered to:     DME Arranged:    DME Agency:     HH Arranged:    HH Agency:     Status of Service:     If discussed at [employer], dates discussed:    Additional Comments:  Eber Hong, RN 09/12/2018, 9:34 AM

## 2018-09-12 NOTE — ED Provider Notes (Signed)
[facility] Emergency Department Provider Note  ____________________________________________   First MD Initiated Contact with Patient 09/12/18 [phone]     (approximate)  I have reviewed the triage vital signs and the nursing notes.   HISTORY  Chief Complaint No chief complaint on file.  Level 5 caveat:  history/ROS limited by acute/critical illness  HPI Reginald Hopkins is a 42 y.o. male with extensive history of psychiatric disease including polysubstance abuse as well as severe COPD reportedly history of CHF.  He presents by EMS with acute onset and severe shortness of breath.  He is able to speak very little, but the history provided by EMS was that he had some difficulty breathing yesterday and he awoke during sleep feeling like he could not breathe at all.  EMS was called by the patient's girlfriend.  When they arrived they found him at about 85% SPO2 and moving very little air.  They placed him on CPAP and administered 125 mg of Solu-Medrol, 1 g of magnesium sulfate (they did not have time to administer the second gram), 1 DuoNeb, and 2 albuterol treatments.  The patient was feeling a little bit better upon arrival and is able to speak a few words at a time which she was not able to do earlier.  He is in severe distress.  He was able to offer the additional information that he snorted methamphetamine tonight to "open up my lungs".  He denies any cocaine use recently.  Past Medical History:  Diagnosis Date  . Anxiety   . Back pain   . CHF (congestive heart failure) (HCC)   . COPD (chronic obstructive pulmonary disease) (HCC)   . PTSD (post-traumatic stress disorder)     Patient Active Problem List   Diagnosis Date Noted  . COPD exacerbation (HCC) 02/10/2018  . 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 Explosive personality disorder (HCC)   . Bipolar 2 disorder, major depressive episode (HCC)   . GAD (generalized  anxiety disorder)   . Substance induced mood disorder (HCC) 03/24/2016  . Cocaine abuse (HCC) 03/24/2016  . PTSD (post-traumatic stress disorder) 03/24/2016  . COPD (chronic obstructive pulmonary disease) (HCC) 03/24/2016  . Antisocial personality disorder (HCC) 03/24/2016    Past Surgical History:  Procedure Laterality Date  . BACK SURGERY      Prior to Admission medications   Medication Sig Start Date End Date Taking? Authorizing Provider  albuterol (PROVENTIL HFA;VENTOLIN HFA) 108 (90 Base) MCG/ACT inhaler Inhale 2 puffs into the lungs every 6 (six) hours as needed for wheezing or shortness of breath. 06/20/18   Minna Antis, MD  guaiFENesin-dextromethorphan (ROBITUSSIN DM) 100-10 MG/5ML syrup Take 5 mLs by mouth every 4 (four) hours as needed for cough. Patient not taking: Reported on 06/20/2018 02/10/18   Milagros Loll, MD  ibuprofen (ADVIL,MOTRIN) 800 MG tablet Take 800 mg by mouth 3 (three) times daily. 01/07/18   [provider]  ipratropium-albuterol (DUONEB) 0.5-2.5 (3) MG/3ML SOLN Take 3 mLs by nebulization every 6 (six) hours as needed. 02/10/18   Milagros Loll, MD  lamoTRIgine (LAMICTAL) 25 MG tablet Take 2 tablets (50 mg total) by mouth daily. Patient not taking: Reported on 02/10/2018 03/26/16   Withrow, Everardo All, FNP  LORazepam (ATIVAN) 1 MG tablet Take 0.5-1 mg by mouth 3 (three) times daily. 1MG -TWICE A DAY 0.5MG -BEDTIME 05/25/18   [provider]  loxapine (LOXITANE) 10 MG capsule Take 20 mg by mouth at bedtime. 01/04/18  [provider]  LYRICA 300 MG capsule Take 300 mg by mouth 2 (two) times daily. 05/26/18   [provider]  oxyCODONE-acetaminophen (PERCOCET) 5-325 MG tablet Take 1 tablet by mouth every 4 (four) hours as needed for severe pain. 06/20/18   Minna Antis, MD  predniSONE (DELTASONE) 20 MG tablet Take 2 tablets (40 mg total) by mouth daily. Patient not taking: Reported on 06/20/2018 02/10/18   Milagros Loll, MD  predniSONE  (DELTASONE) 50 MG tablet Take 1 tab, PO Daily x5 days Patient not taking: Reported on 06/20/2018 05/21/18   Myrna Blazer, MD  QUEtiapine (SEROQUEL) 200 MG tablet Take 1 tablet (200 mg total) by mouth at bedtime. 03/25/16   Withrow, Everardo All, FNP  Respiratory Therapy Supplies (NEBULIZER) DEVI Nebulizer machine and supplies 02/10/18   Milagros Loll, MD  tamsulosin (FLOMAX) 0.4 MG CAPS capsule Take 1 capsule (0.4 mg total) by mouth daily. 06/20/18   Minna Antis, MD    Allergies Patient has no known allergies.  History reviewed. No pertinent family history.  Social History Social History   Tobacco Use  . Smoking status: Former Games developer  . Smokeless tobacco: Never Used  Substance Use Topics  . Alcohol use: Yes  . Drug use: Yes    Types: Cocaine, Marijuana    Review of Systems Level 5 caveat:  history/ROS limited by acute/critical illness  ____________________________________________   PHYSICAL EXAM:  ED Triage Vitals  Enc Vitals Group     BP 09/12/18 0600 (!) 141/85     Pulse Rate 09/12/18 0552 98     Resp 09/12/18 0552 (!) 29     Temp --      Temp src --      SpO2 09/12/18 0552 100 %     Weight 09/12/18 0553 63.5 kg (140 lb)     Height 09/12/18 0553 1.753 m (5\' 9" )     Head Circumference --      Peak Flow --      Pain Score 09/12/18 0553 10     Pain Loc --      Pain Edu? --      Excl. in GC? --       Constitutional: Alert.  Severe respiratory distress. Eyes: Conjunctivae are normal.  Head: Atraumatic. Nose: No congestion/rhinnorhea. Mouth/Throat: Mucous membranes are moist. Neck: No stridor.  No meningeal signs.   Cardiovascular: Normal rate, regular rhythm. Good peripheral circulation. Grossly normal heart sounds. Respiratory: Increased respiratory effort with severe intercostal muscle retractions and accessory muscle usage.  Almost no air is moving throughout any lung fields, a very slight expiratory wheeze could be heard in the apices  bilaterally. Gastrointestinal: Very thin body habitus.  Soft and nontender. No distention.  Musculoskeletal: No lower extremity tenderness nor edema. No gross deformities of extremities. Neurologic: Moving all 4 extremities and there is no evidence of any focal neurological deficit, but the patient is not able to participate in the exam. Skin:  Skin is warm, dry and intact. No rash noted.   ____________________________________________   LABS (all labs ordered are listed, but only abnormal results are displayed)  Labs Reviewed  COMPREHENSIVE METABOLIC PANEL - Abnormal; Notable for the following components:      Result Value   Glucose, Bld 137 (*)    BUN 27 (*)    Calcium 8.7 (*)    AST 46 (*)    ALT 68 (*)    Total Bilirubin 1.8 (*)    All other components within normal  limits  ACETAMINOPHEN LEVEL - Abnormal; Notable for the following components:   Acetaminophen (Tylenol), Serum <10 (*)    All other components within normal limits  BLOOD GAS, ARTERIAL - Abnormal; Notable for the following components:   pH, Arterial 7.30 (*)    pCO2 arterial 50 (*)    pO2, Arterial 548 (*)    Acid-base deficit 2.5 (*)    All other components within normal limits  ETHANOL  LIPASE, BLOOD  BRAIN NATRIURETIC PEPTIDE  TROPONIN I  SALICYLATE LEVEL  CBC WITH DIFFERENTIAL/PLATELET   ____________________________________________  EKG  ED ECG REPORT I, Loleta Rose, the attending physician, personally viewed and interpreted this ECG.  Date: 09/12/2018 EKG Time: 5:49 AM Rate: 106 Rhythm: Sinus tachycardia QRS Axis: normal Intervals: normal ST/T Wave abnormalities: Non-specific ST segment / T-wave changes, but no evidence of acute ischemia. Narrative Interpretation: no evidence of acute ischemia   ____________________________________________  RADIOLOGY I, Loleta Rose, personally viewed and evaluated these images (plain radiographs) as part of my medical decision making, as well as  reviewing the written report by the radiologist.  ED MD interpretation:  No acute abnormalities  Official radiology report(s): Dg Chest Portable 1 View  Result Date: 09/12/2018 CLINICAL DATA:  Awoke with respiratory distress. EXAM: PORTABLE CHEST 1 VIEW COMPARISON:  05/21/2018 FINDINGS: Chronic hyperinflation. No focal airspace disease. Normal heart size and mediastinal contours. No pulmonary edema, pneumothorax, or pleural effusion. No acute osseous abnormalities are seen. IMPRESSION: Chronic hyperinflation without acute finding. Electronically Signed   By: Narda Rutherford M.D.   On: 09/12/2018 06:14    ____________________________________________   PROCEDURES  Critical Care performed: Yes, see critical care procedure note(s)   Procedure(s) performed:   .Critical Care Performed by: Loleta Rose, MD Authorized by: Loleta Rose, MD   Critical care provider statement:    Critical care time (minutes):  30   Critical care time was exclusive of:  Separately billable procedures and treating other patients   Critical care was necessary to treat or prevent imminent or life-threatening deterioration of the following conditions:  Respiratory failure   Critical care was time spent personally by me on the following activities:  Development of treatment plan with patient or surrogate, discussions with consultants, evaluation of patient's response to treatment, examination of patient, obtaining history from patient or surrogate, ordering and performing treatments and interventions, ordering and review of laboratory studies, ordering and review of radiographic studies, pulse oximetry, re-evaluation of patient's condition and review of old charts     ____________________________________________   INITIAL IMPRESSION / ASSESSMENT AND PLAN / ED COURSE  As part of my medical decision making, I reviewed the following data within the electronic MEDICAL RECORD NUMBER Nursing notes reviewed and  incorporated, Labs reviewed , EKG interpreted , Old chart reviewed, Patient signed out to , Radiograph reviewed  and Discussed with admitting physician     Differential diagnosis includes, but is not limited to, COPD exacerbation, chemical irritant/pneumonitis, pneumothorax, ACS, less likely pulmonary embolism.  We are putting the patient on BiPAP immediately, administering in-line DuoNeb/albuterol, I am giving another 1 g of magnesium sulfate by IV, and I have called x-ray to get a stat chest x-ray to make sure that the patient does not have pneumothoraces.  According to EMS he is looking a little bit better than he was prior but he is toxic in appearance right now based on his respiratory status.  Intubation is a very distinct possibility if he does not turnaround shortly.  The patient is  trying to yell for something for his anxiety and given his respiratory status as well as the concurrent methamphetamine use, I think it is appropriate to give Ativan 1 mg IV.  If this is not effective I may use ketamine at an analgesic dose.  He is reporting 10 out of 10 pain but it is unclear what is hurting.  I am also giving 1 L normal saline for the insensible loss because there is no evidence based on his current clinical presentation and that this is the result of the CHF exacerbation.  EKG shows no sign of ischemia.  Clinical Course as of Sep 12 728  Tue Sep 12, 2018  0607 Magnesium sulfate 1 g IV is not readily available - will administer 2 g IV since he needs it and there is very little downside or risk of complication to getting an "extra" 1 g.   [CF]  0617 Patient feeling a little better on Bipap, able to lie back slightly.  Slightly improved air movement.  Asking for pain medication, but will hold off for now given respiratory status and the fact he just got Ativan.   [CF]  0618 Hyperinflation with no evidence of pneumothoraces, pulmonary edema, nor pneumonia.  DG Chest Portable 1 View [CF]  0636  Normal CBC  CBC with Differential [CF]  0643 Labs are all within normal limits including a negative troponin.  The only 2 that are still pending are the BNP and ethanol level.  Patient has improved from prior is still moving relatively minimal air and is getting continuous albuterol.  I will discuss the case with the hospitalist for admission.   [CF]  0707 Discussed with hospitalist by phone who will admit.   [CF]    Clinical Course User Index [CF] Loleta Rose, MD    ____________________________________________  FINAL CLINICAL IMPRESSION(S) / ED DIAGNOSES  Final diagnoses:  Acute respiratory failure with hypoxia and hypercapnia (HCC)  Polysubstance abuse (HCC)     MEDICATIONS GIVEN DURING THIS VISIT:  Medications  albuterol (PROVENTIL) (2.5 MG/3ML) 0.083% nebulizer solution (has no administration in time range)  sodium chloride 0.9 % bolus 1,000 mL (has no administration in time range)  sodium chloride 0.9 % bolus 1,000 mL (0 mLs Intravenous Stopped 09/12/18 0649)  LORazepam (ATIVAN) injection 1 mg (1 mg Intravenous Given 09/12/18 0603)  ipratropium-albuterol (DUONEB) 0.5-2.5 (3) MG/3ML nebulizer solution (  Given 09/12/18 0559)  magnesium sulfate IVPB 2 g 50 mL (2 g Intravenous New Bag/Given 09/12/18 0613)  albuterol (PROVENTIL) (2.5 MG/3ML) 0.083% nebulizer solution (5 mg/hr Nebulization Given 09/12/18 1610)     ED Discharge Orders    None       Note:  This document was prepared using Dragon voice recognition software and may include unintentional dictation errors.    Loleta Rose, MD 09/12/18 [phone]

## 2018-09-12 NOTE — Progress Notes (Signed)
   09/12/18 0600  Clinical Encounter Type  Visited With Patient  Visit Type Initial;Spiritual support  Referral From Nurse  Recommendations Follow-up, as requested.  Spiritual Encounters  Spiritual Needs Emotional   Chaplain was paged to the OR to meet the patient, who was in-route to [facility]. The patient was not able to speak easily. Chaplain asked about attending family and no family was present. Chaplain offered energetic prayer and emotional support.

## 2018-09-12 NOTE — Progress Notes (Signed)
Hx of COPD, CHF, not on oxygen at home- denies smoking Ran out of his Nebs and Inhalers  SOB since last night, tried Methamphatamine snorting to get help, but it did notOn arrival to ER- SPO2 85% and very little air entry in lungs.  COmfortable on BIPAP- admit to stepdown unit for ac respi failure and COPD exacerbation.

## 2018-09-13 ENCOUNTER — Inpatient Hospital Stay: Payer: Medicare Other

## 2018-09-13 LAB — BASIC METABOLIC PANEL
ANION GAP: 7 (ref 5–15)
BUN: 17 mg/dL (ref 6–20)
CO2: 26 mmol/L (ref 22–32)
Calcium: 8.9 mg/dL (ref 8.9–10.3)
Chloride: 104 mmol/L (ref 98–111)
Creatinine, Ser: 0.96 mg/dL (ref 0.61–1.24)
GFR calc Af Amer: >60 mL/min (ref >60)
GLUCOSE: 116 mg/dL — AB (ref 70–99)
Potassium: 3.6 mmol/L (ref 3.5–5.1)
Sodium: 137 mmol/L (ref 135–145)

## 2018-09-13 LAB — CBC
HCT: 38.7 % — ABNORMAL LOW (ref 40.0–52.0)
HEMOGLOBIN: 13.9 g/dL (ref 13.0–18.0)
MCH: 31.6 pg (ref 26.0–34.0)
MCHC: 35.9 g/dL (ref 32.0–36.0)
MCV: 88 fL (ref 80.0–100.0)
PLATELETS: 224 10*3/uL (ref 150–440)
RBC: 4.39 MIL/uL — ABNORMAL LOW (ref 4.40–5.90)
RDW: 13.9 % (ref 11.5–14.5)
WBC: 9.2 10*3/uL (ref 3.8–10.6)

## 2018-09-13 MED ORDER — DILTIAZEM HCL 30 MG PO TABS
60.0000 mg | ORAL_TABLET | Freq: Three times a day (TID) | ORAL | Status: DC
  Administered 2018-09-13 (×3): 60 mg via ORAL
  Filled 2018-09-13 (×4): qty 2

## 2018-09-13 MED ORDER — SODIUM CHLORIDE 0.9% FLUSH
3.0000 mL | Freq: Two times a day (BID) | INTRAVENOUS | Status: DC
  Administered 2018-09-13 – 2018-09-15 (×2): 3 mL via INTRAVENOUS

## 2018-09-13 MED ORDER — LORAZEPAM 2 MG/ML IJ SOLN: 1.0000 mg | Freq: Once | INTRAMUSCULAR | Status: DC

## 2018-09-13 MED ORDER — HYDROCODONE-ACETAMINOPHEN 10-325 MG PO TABS
1.0000 | ORAL_TABLET | Freq: Two times a day (BID) | ORAL | Status: DC | PRN
  Administered 2018-09-13 – 2018-09-15 (×5): 1 via ORAL
  Filled 2018-09-13 (×5): qty 1

## 2018-09-13 MED ORDER — LORAZEPAM 1 MG PO TABS
1.0000 mg | ORAL_TABLET | Freq: Three times a day (TID) | ORAL | Status: DC
  Administered 2018-09-13 – 2018-09-14 (×2): 1 mg via ORAL
  Filled 2018-09-13 (×2): qty 1

## 2018-09-13 MED ORDER — LORAZEPAM 2 MG/ML IJ SOLN
2.0000 mg | INTRAMUSCULAR | Status: DC | PRN
  Administered 2018-09-13 (×3): 2 mg via INTRAVENOUS
  Filled 2018-09-13 (×3): qty 1

## 2018-09-13 MED ORDER — GUAIFENESIN-DM 100-10 MG/5ML PO SYRP
5.0000 mL | ORAL_SOLUTION | ORAL | Status: DC | PRN
  Administered 2018-09-13: 5 mL via ORAL
  Filled 2018-09-13: qty 5

## 2018-09-13 MED ORDER — GUAIFENESIN-CODEINE 100-10 MG/5ML PO SOLN
5.0000 mL | Freq: Four times a day (QID) | ORAL | Status: DC | PRN
  Administered 2018-09-13 – 2018-09-15 (×5): 5 mL via ORAL
  Filled 2018-09-13 (×5): qty 5

## 2018-09-13 NOTE — Plan of Care (Signed)

## 2018-09-13 NOTE — Progress Notes (Signed)
Lab came to draw lab and called me and reported that pt IV came off. Went to assessed pt and IV was almost out of his site. Attempted once to start IV but was unsuccessful and pt didn't want to try another attempt. Notify IV team. Will continue to monitor.

## 2018-09-13 NOTE — Care Management (Signed)
Found that patient's pharmacy carrier is [facility].  He has a 275 dollar deductible. Confirmed that the DuoNeb is not covered under part D but very possibly may be covered under medicare B.  Informed of the following:   Procair covered -Elwin Sleight is not covered - Albuterol nebulizer solution is covered under Medicare B Incruse Elipta covered - Spiriva not covered - Iprotropium neb solution covered

## 2018-09-13 NOTE — Progress Notes (Addendum)
Pt states percocet is not helping with pain and states pain is better controlled with hydrocodone. Page prime. Will continue to monitor.  Update 0038: Doctor Caryn Bee ordered hydrocodone 10-325- mg 1 tablet for severe pain two times daily PRN and discontinue Oxycodone -acetaminophen 5-325 mg 1 tablet.. Will continue to monitor.

## 2018-09-13 NOTE — Care Management (Signed)
[facility] does not know the name of insurance carrier for pharmacy coverage and neither does patient.  [facility] is assisting to identify carrier so caremanager can investigate copays and need for PA for any medication.

## 2018-09-13 NOTE — Progress Notes (Signed)
[facility] [place] [facility]   PATIENT NAME: Reginald Hopkins    MR#:  [id number]  DATE OF BIRTH:  [birth year]  SUBJECTIVE:  CHIEF COMPLAINT:  No chief complaint on file.  Came with respiratory distress and required BiPAP initially.  Improved and transferred to regular floor. Agitated today and have tachycardia.  REVIEW OF SYSTEMS:  CONSTITUTIONAL: No fever, fatigue or weakness.  EYES: No blurred or double vision.  EARS, NOSE, AND THROAT: No tinnitus or ear pain.  Complains of throat pain. RESPIRATORY: No cough, shortness of breath, wheezing or hemoptysis.  CARDIOVASCULAR: No chest pain, orthopnea, edema.  GASTROINTESTINAL: No nausea, vomiting, diarrhea or abdominal pain.  GENITOURINARY: No dysuria, hematuria.  ENDOCRINE: No polyuria, nocturia,  HEMATOLOGY: No anemia, easy bruising or bleeding SKIN: No rash or lesion. MUSCULOSKELETAL: No joint pain or arthritis.   NEUROLOGIC: No tingling, numbness, weakness.  PSYCHIATRY: No anxiety or depression.   ROS  DRUG ALLERGIES:  No Known Allergies  VITALS:  Blood pressure 129/88, pulse (!) 111, temperature (!) 97.4 F (36.3 C), temperature source Oral, resp. rate 18, height 5\' 11"  (1.803 m), weight 63.4 kg, SpO2 91 %.  PHYSICAL EXAMINATION:  GENERAL:  42 y.o.-year-old patient standing in the room with no acute distress.  Appears somewhat agitated, but cooperative. EYES: Pupils equal, round, reactive to light and accommodation. No scleral icterus. Extraocular muscles intact.  HEENT: Head atraumatic, normocephalic. Oropharynx and nasopharynx clear.  NECK:  Supple, no jugular venous distention. No thyroid enlargement, no tenderness.  LUNGS: Normal breath sounds bilaterally, no wheezing, rales,rhonchi or crepitation. No use of accessory muscles of respiration.  CARDIOVASCULAR: S1, S2 fast and regular. No murmurs, rubs, or gallops.  ABDOMEN: Soft, nontender, nondistended. Bowel sounds present. No organomegaly or mass.   EXTREMITIES: No pedal edema, cyanosis, or clubbing.  NEUROLOGIC: Cranial nerves II through XII are intact. Muscle strength 5/5 in all extremities. Sensation intact. Gait not checked.  PSYCHIATRIC: The patient is alert and oriented x 3. Agitated/ anxious, but co-operative. SKIN: No obvious rash, lesion, or ulcer.   Physical Exam LABORATORY PANEL:   CBC Recent Labs  Lab 09/13/18 0452  WBC 9.2  HGB 13.9  HCT 38.7*  PLT 224   ------------------------------------------------------------------------------------------------------------------  Chemistries  Recent Labs  Lab 09/12/18 0554 09/13/18 0452  NA 140 137  K 3.7 3.6  CL 104 104  CO2 27 26  GLUCOSE 137* 116*  BUN 27* 17  CREATININE 1.10 0.96  CALCIUM 8.7* 8.9  AST 46*  --   ALT 68*  --   ALKPHOS 56  --   BILITOT 1.8*  --    ------------------------------------------------------------------------------------------------------------------  Cardiac Enzymes Recent Labs  Lab 09/12/18 0554  TROPONINI <0.03   ------------------------------------------------------------------------------------------------------------------  RADIOLOGY:  Dg Chest 2 View  Result Date: 09/13/2018 CLINICAL DATA:  COPD exacerbation. EXAM: CHEST - 2 VIEW COMPARISON:  Radiographs yesterday FINDINGS: Again seen hyperinflation. No focal airspace disease. Unchanged heart size and mediastinal contours. No pleural effusion or pneumothorax. No acute osseous abnormalities. IMPRESSION: Unchanged hyperinflation.  No acute abnormality. Electronically Signed   By: Narda Rutherford M.D.   On: 09/13/2018 07:01   Dg Chest Portable 1 View  Result Date: 09/12/2018 CLINICAL DATA:  Awoke with respiratory distress. EXAM: PORTABLE CHEST 1 VIEW COMPARISON:  05/21/2018 FINDINGS: Chronic hyperinflation. No focal airspace disease. Normal heart size and mediastinal contours. No pulmonary edema, pneumothorax, or pleural effusion. No acute osseous abnormalities are seen.  IMPRESSION: Chronic hyperinflation without acute finding. Electronically Signed   By:  Narda Rutherford M.D.   On: 09/12/2018 06:14    ASSESSMENT AND PLAN:   Principal Problem:   Acute respiratory failure with hypoxia (HCC) Active Problems:   COPD exacerbation (HCC)   Acute respiratory failure (HCC)  *Acute respiratory failure with hypoxia COPD exacerbation  IV and inhaled steroids, nebulizer therapy, on admission BiPAP use. Improved, stop IV steroids, on room air now.  * Drug withdrawal   Multi drug abuse   IV ativan PRN, Psych consult.   Monitor on tele.  *Anxiety Give Ativan.  *Post traumatic stress disorder Continue Seroquel.  * Sinus tachycardia   Cardizem oral. Likely due to withdrawal.     All the records are reviewed and case discussed with Care Management/Social Workerr. Management plans discussed with the patient, family and they are in agreement.  CODE STATUS: Full.  TOTAL TIME TAKING CARE OF THIS PATIENT:45 minutes.    POSSIBLE D/C IN 2-3 DAYS, DEPENDING ON CLINICAL CONDITION.   Altamese Dilling M.D on 09/13/2018   Between 7am to 6pm - Pager - [phone]  After 6pm go to [email or web address] - password [email or web address]  [facility] [place] [facility]  Office  [phone]  CC: Primary care physician; Patient, No Pcp Per  Note: This dictation was prepared with Dragon dictation along with smaller phrase technology. Any transcriptional errors that result from this process are unintentional.

## 2018-09-13 NOTE — Plan of Care (Signed)
  Problem: Coping: Goal: Level of anxiety will decrease Outcome: Not Progressing Note:  Awakened for medications.  Pt anxious, yelling/cursing at staff.  Unsteady on feet but insists on walking to bathroom to void.  Incontinent of urine.  Cooperative with taking medications.

## 2018-09-13 NOTE — Plan of Care (Signed)
  Problem: Health Behavior/Discharge Planning: Goal: Ability to manage health-related needs will improve Outcome: Not Progressing Note:  Patient very anxious, restless, and sometimes irritable. PO and IV Ativan already administered. Will continue to monitor for possible ICU transfer. Reginald Hopkins [facility]

## 2018-09-14 DIAGNOSIS — J9602 Acute respiratory failure with hypercapnia: Secondary | ICD-10-CM

## 2018-09-14 DIAGNOSIS — F1110 Opioid abuse, uncomplicated: Secondary | ICD-10-CM

## 2018-09-14 DIAGNOSIS — F070 Personality change due to known physiological condition: Secondary | ICD-10-CM

## 2018-09-14 DIAGNOSIS — F1910 Other psychoactive substance abuse, uncomplicated: Secondary | ICD-10-CM

## 2018-09-14 DIAGNOSIS — S0990XS Unspecified injury of head, sequela: Secondary | ICD-10-CM

## 2018-09-14 DIAGNOSIS — F1510 Other stimulant abuse, uncomplicated: Secondary | ICD-10-CM

## 2018-09-14 LAB — HIV ANTIBODY (ROUTINE TESTING W REFLEX): HIV SCREEN 4TH GENERATION: NONREACTIVE

## 2018-09-14 MED ORDER — LORAZEPAM 2 MG/ML IJ SOLN
1.0000 mg | INTRAMUSCULAR | Status: DC | PRN
  Administered 2018-09-14 – 2018-09-15 (×2): 1 mg via INTRAVENOUS
  Filled 2018-09-14 (×2): qty 1

## 2018-09-14 MED ORDER — CLONAZEPAM 0.5 MG PO TABS
0.5000 mg | ORAL_TABLET | Freq: Three times a day (TID) | ORAL | Status: DC
  Administered 2018-09-14 – 2018-09-15 (×2): 0.5 mg via ORAL
  Filled 2018-09-14 (×2): qty 1

## 2018-09-14 NOTE — Consult Note (Signed)
[facility] Face-to-Face Psychiatry Consult   Reason for Consult: Consult for this 42-year-old man who came into the hospital with respiratory arrest.  Concern about substance abuse Referring Physician: Vachhani Patient Identification: Reginald Hopkins MRN:  [id number] Principal Diagnosis: Acute respiratory failure with hypoxia (HCC) Diagnosis:   Patient Active Problem List   Diagnosis Date Noted  . 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 Acute respiratory failure with hypoxia (HCC) [J96.01] 09/12/2018  . Acute respiratory failure (HCC) [J96.00] 09/12/2018  . COPD exacerbation (HCC) [J44.1] 02/10/2018  . Adjustment disorder with mixed disturbance of emotions and conduct [F43.25] 11/28/2017  . 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 Bipolar 2 disorder, major depressive episode (HCC) [F31.81]   . GAD (generalized anxiety disorder) [F41.1]   . Substance induced mood disorder (HCC) [F19.94] 03/24/2016  . Cocaine abuse (HCC) [F14.10] 03/24/2016  . PTSD (post-traumatic stress disorder) [F43.10] 03/24/2016  . COPD (chronic obstructive pulmonary disease) (HCC) [J44.9] 03/24/2016  . Antisocial personality disorder (HCC) [F60.2] 03/24/2016    Total Time spent with patient: 1 hour  Subjective:   Reginald Hopkins is a 42 y.o. male patient admitted with "damn right my drug use is a problem".  HPI: Patient interviewed chart reviewed.  Patient familiar from some previous encounters.  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  The patient tells me that all he knows is that his girlfriend said that he had turned blue and stopped breathing in bed.   Coincidentally, he mentions that he had been snorting heroin shortly before that.  Patient does not make a clear connection between these things.  He also had been snorting methamphetamine recently.  Patient says his mood has been irritable as usual.  Not necessarily depressed.  He has chronic mood swings some difficulty sleeping some poor appetite.  Denies having any suicidal thoughts.  Denies any homicidal thoughts.  He says he continues to take his psychiatric medicine and goes to the substance abuse outpatient program through [facility].  He criticizes his psychiatric medicine but seems to have benefited from and be largely compliant with the quetiapine and clonazepam that he is prescribed.  Patient admits to ongoing substance abuse.  He says that he does not use needles but he does snort drugs and that he had recently been using heroin and methamphetamine.  The drug screen is positive not only for opiates and amphetamines but also for benzodiazepines and cocaine.  Social history: Fortunately he does have a place to live right now.  He and his girlfriend seem to be getting along adequately.  Medical history: Patient suffered a brain injury years ago which has caused him chronic mood and cognitive problems.  He has bad COPD.  Substance abuse history: Long history of cocaine use.  More recently it looks like he is starting to use more opiates and sedatives and methamphetamine.  He engages in substance abuse programs is not clear if he is really ever had much sobriety but I think he at least understands that it is a problem  Past Psychiatric History: Patient has had prior admissions a couple of times but no actual history of suicide attempts.  He loses his temper easily   but does not get particularly violent usually.  He is been on some medicine for mood stabilization for years.  Diagnosis of explosive personality GAD PTSD  Risk to Self:   Risk to Others:   Prior Inpatient Therapy:   Prior  Outpatient Therapy:    Past Medical History:  Past Medical History:  Diagnosis Date  . Anxiety   . Back pain   . CHF (congestive heart failure) ([place])   . COPD (chronic obstructive pulmonary disease) ([place])   . PTSD (post-traumatic stress disorder)     Past Surgical History:  Procedure Laterality Date  . BACK SURGERY     Family History:  Family History  Problem Relation Age of Onset  . Hypertension Father    Family Psychiatric  History: Unknown Social History:  Social History   Substance and Sexual Activity  Alcohol Use Yes     Social History   Substance and Sexual Activity  Drug Use Yes  . Types: Cocaine, Marijuana    Social History   Socioeconomic History  . Marital status: Divorced    Spouse name: Not on file  . Number of children: Not on file  . Years of education: Not on file  . Highest education level: Not on file  Occupational History  . Not on file  Social Needs  . Financial resource strain: Not on file  . Food insecurity:    Worry: Not on file    Inability: Not on file  . Transportation needs:    Medical: Not on file    Non-medical: Not on file  Tobacco Use  . Smoking status: Former Research scientist (life sciences)  . Smokeless tobacco: Never Used  Substance and Sexual Activity  . Alcohol use: Yes  . Drug use: Yes    Types: Cocaine, Marijuana  . Sexual activity: Not on file  Lifestyle  . Physical activity:    Days per week: Not on file    Minutes per session: Not on file  . Stress: Not on file  Relationships  . Social connections:    Talks on phone: Not on file    Gets together: Not on file    Attends religious service: Not on file    Active member of club or organization: Not on file    Attends meetings of clubs or organizations: Not on file    Relationship status: Not on file  Other Topics Concern  . Not on file  Social History Narrative  . Not on file   Additional Social History:    Allergies:  No Known Allergies  Labs:  Results for orders placed or  performed during the hospital encounter of 09/12/18 (from the past 48 hour(s))  HIV antibody (Routine Testing)     Status: None   Collection Time: 09/13/18  4:52 AM  Result Value Ref Range   HIV Screen 4th Generation wRfx Non Reactive Non Reactive    Comment: (NOTE) Performed At: [facility] [place], [place] [id number] Rush Farmer MD MG:[id number]   Basic metabolic panel     Status: Abnormal   Collection Time: 09/13/18  4:52 AM  Result Value Ref Range   Sodium 137 135 - 145 mmol/L   Potassium 3.6 3.5 - 5.1 mmol/L   Chloride 104 98 - 111 mmol/L   CO2 26 22 - 32 mmol/L   Glucose, Bld 116 (H) 70 - 99 mg/dL   BUN 17 6 - 20 mg/dL   Creatinine, Ser 0.96 0.61 - 1.24 mg/dL   Calcium  8.9 8.9 - 10.3 mg/dL   GFR calc non Af Amer >60 >60 mL/min   GFR calc Af Amer >60 >60 mL/min    Comment: (NOTE) The eGFR has been calculated using the CKD EPI equation. This calculation has not been validated in all clinical situations. eGFR's persistently <60 mL/min signify possible Chronic Kidney Disease.    Anion gap 7 5 - 15    Comment: Performed at [facility], [address] [place], [place] [address]  CBC     Status: Abnormal   Collection Time: 09/13/18  4:52 AM  Result Value Ref Range   WBC 9.2 3.8 - 10.6 K/uL   RBC 4.39 (L) 4.40 - 5.90 MIL/uL   Hemoglobin 13.9 13.0 - 18.0 g/dL   HCT 38.7 (L) 40.0 - 52.0 %   MCV 88.0 80.0 - 100.0 fL   MCH 31.6 26.0 - 34.0 pg   MCHC 35.9 32.0 - 36.0 g/dL   RDW 13.9 11.5 - 14.5 %   Platelets 224 150 - 440 K/uL    Comment: Performed at [facility], [address] , [place] [address]    Current Facility-Administered Medications  Medication Dose Route Frequency Provider Last Rate Last Dose  . clonazePAM (KLONOPIN) tablet 0.5 mg  0.5 mg Oral TID , Mylon T, MD      . docusate sodium (COLACE) capsule 100 mg  100 mg Oral BID PRN Vachhani, Vaibhavkumar, MD      . guaiFENesin-codeine 100-10 MG/5ML  solution 5 mL  5 mL Oral Q6H PRN Vachhani, Vaibhavkumar, MD   5 mL at 09/14/18 1530  . heparin injection 5,000 Units  5,000 Units Subcutaneous Q8H Vachhani, Vaibhavkumar, MD   5,000 Units at 09/13/18 2130  . HYDROcodone-acetaminophen (NORCO) 10-325 MG per tablet 1 tablet  1 tablet Oral BID PRN Maier, Angela, MD   1 tablet at 09/14/18 1602  . LORazepam (ATIVAN) injection 1 mg  1 mg Intravenous Once Vachhani, Vaibhavkumar, MD      . LORazepam (ATIVAN) injection 1 mg  1 mg Intravenous Q4H PRN Vachhani, Vaibhavkumar, MD      . mometasone-formoterol (DULERA) 100-5 MCG/ACT inhaler 2 puff  2 puff Inhalation BID Kasa, Kurian, MD   2 puff at 09/14/18 1533  . pregabalin (LYRICA) capsule 300 mg  300 mg Oral BID Vachhani, Vaibhavkumar, MD   300 mg at 09/13/18 2129  . QUEtiapine (SEROQUEL) tablet 200 mg  200 mg Oral QHS Vachhani, Vaibhavkumar, MD   200 mg at 09/13/18 2129  . sodium chloride flush (NS) 0.9 % injection 3 mL  3 mL Intravenous Q12H Vachhani, Vaibhavkumar, MD   3 mL at 09/13/18 2156  . tamsulosin (FLOMAX) capsule 0.4 mg  0.4 mg Oral Daily Vachhani, Vaibhavkumar, MD   0.4 mg at 09/13/18 0934  . tiotropium (SPIRIVA) inhalation capsule 18 mcg  18 mcg Inhalation q morning - 10a Vachhani, Vaibhavkumar, MD   18 mcg at 09/14/18 1533    Musculoskeletal: Strength & Muscle Tone: within normal limits Gait & Station: unsteady Patient leans: N/A  Psychiatric Specialty Exam: Physical Exam  Nursing note and vitals reviewed. Constitutional: He appears well-developed and well-nourished.  HENT:  Head: Normocephalic and atraumatic.  Eyes: Pupils are equal, round, and reactive to light. Conjunctivae are normal.  Neck: Normal range of motion.  Cardiovascular: Regular rhythm and normal heart sounds.  Respiratory: Effort normal. No respiratory distress.  GI: Soft.  Musculoskeletal: Normal range of motion.  Neurological: He is alert.  Skin: Skin is warm   and dry.  Psychiatric: His affect is labile. His affect  is not blunt. His speech is tangential. He is agitated. He is not aggressive. Thought content is not paranoid. Cognition and memory are impaired. He expresses impulsivity and inappropriate judgment. He expresses no homicidal and no suicidal ideation. He exhibits abnormal recent memory.    Review of Systems  Constitutional: Negative.   HENT: Negative.   Eyes: Negative.   Respiratory: Positive for shortness of breath.   Cardiovascular: Negative.   Gastrointestinal: Negative.   Musculoskeletal: Negative.   Skin: Negative.   Neurological: Negative.   Psychiatric/Behavioral: Positive for memory loss and substance abuse. Negative for depression, hallucinations and suicidal ideas. The patient is nervous/anxious and has insomnia.     Blood pressure 115/78, pulse 85, temperature 98.1 F (36.7 C), temperature source Oral, resp. rate (!) 21, height 5' 11" (1.803 m), weight 60.3 kg, SpO2 90 %.Body mass index is 18.55 kg/m.  General Appearance: Casual  Eye Contact:  Good  Speech:  Pressured  Volume:  Increased  Mood:  Irritable  Affect:  Labile  Thought Process:  Coherent  Orientation:  Full (Time, Place, and Person)  Thought Content:  Rumination and Tangential  Suicidal Thoughts:  No  Homicidal Thoughts:  No  Memory:  Immediate;   Fair Recent;   Fair Remote;   Fair  Judgement:  Impaired  Insight:  Shallow  Psychomotor Activity:  Restlessness  Concentration:  Concentration: Poor  Recall:  Poor  Fund of Knowledge:  Fair  Language:  Fair  Akathisia:  No  Handed:  Right  AIMS (if indicated):     Assets:  Desire for Improvement Housing Resilience  ADL's:  Impaired  Cognition:  Impaired,  Mild  Sleep:        Treatment Plan Summary: Plan Patient is probably pretty much at his baseline in terms of mental state.  It is very unfortunate and dangerous that he continues with his substance abuse particularly if he is using opiates.  I impressed upon him that there is a good chance that his  use of heroin had something to do with why he turned blue and stopped breathing.  Made it clear that opiate abuse carries with it a high risk of death.  Patient acknowledges this and says that he wishes that he could get into Suboxone treatment.  I told him I thought that would probably be a pretty good idea but there is nothing we can do about it from the hospital.  He should talk with the people at [facility] and see if they can get him into a Suboxone provider.  Psychoeducation completed.  No need to change any medicine except that I did put him back on his regular dose of clonazepam which he likes better than the Ativan.  I will follow-up just as needed.  Disposition: No evidence of imminent risk to self or others at present.   Patient does not meet criteria for psychiatric inpatient admission. Supportive therapy provided about ongoing stressors. Discussed crisis plan, support from social network, calling 911, coming to the Emergency Department, and calling Suicide Hotline.  Tyjay , MD 09/14/2018 6:06 PM 

## 2018-09-14 NOTE — Progress Notes (Signed)
Pt uncooperative with staff.  Refuses heparin injection and Cardizem tablet.  States "no, I'm not doing that" when requested to stand for daily weight.  Dozing in bed with neck hyperextended.  Male companion remained at bedside this shift.

## 2018-09-14 NOTE — Progress Notes (Signed)
Patient very drowsy but arousable to voice. Refused all morning medicines and breakfast. Falls asleep between questions. Girlfriend at bedside. Will continue to monitor and try meds when patient is more awake.

## 2018-09-14 NOTE — Progress Notes (Signed)
[employer] [facility] -  at [facility]   PATIENT NAME: Reginald Hopkins    MR#:  [id number]  DATE OF BIRTH:  [birth year]  SUBJECTIVE:  CHIEF COMPLAINT:  No chief complaint on file.  Came with respiratory distress and required BiPAP initially.  Improved and transferred to regular floor. Was agitated and in withdrawal yesterday, given ativan, and sleepy , drowsy now.  REVIEW OF SYSTEMS:  CONSTITUTIONAL: No fever, fatigue or weakness.  EYES: No blurred or double vision.  EARS, NOSE, AND THROAT: No tinnitus or ear pain.  Complains of throat pain. RESPIRATORY: No cough, shortness of breath, wheezing or hemoptysis.  CARDIOVASCULAR: No chest pain, orthopnea, edema.  GASTROINTESTINAL: No nausea, vomiting, diarrhea or abdominal pain.  GENITOURINARY: No dysuria, hematuria.  ENDOCRINE: No polyuria, nocturia,  HEMATOLOGY: No anemia, easy bruising or bleeding SKIN: No rash or lesion. MUSCULOSKELETAL: No joint pain or arthritis.   NEUROLOGIC: No tingling, numbness, weakness.  PSYCHIATRY: No anxiety or depression.   ROS  DRUG ALLERGIES:  No Known Allergies  VITALS:  Blood pressure 110/72, pulse 68, temperature (!) 97.5 F (36.4 C), temperature source Oral, resp. rate 18, height 5\' 11"  (1.803 m), weight 60.3 kg, SpO2 94 %.  PHYSICAL EXAMINATION:  GENERAL:  42 y.o.-year-old patient standing in the room with no acute distress.  Appears somewhat agitated, but cooperative. EYES: Pupils equal, round, reactive to light and accommodation. No scleral icterus. Extraocular muscles intact.  HEENT: Head atraumatic, normocephalic. Oropharynx and nasopharynx clear.  NECK:  Supple, no jugular venous distention. No thyroid enlargement, no tenderness.  LUNGS: Normal breath sounds bilaterally, no wheezing, rales,rhonchi or crepitation. No use of accessory muscles of respiration.  CARDIOVASCULAR: S1, S2 fast and regular. No murmurs, rubs, or gallops.  ABDOMEN: Soft, nontender, nondistended. Bowel  sounds present. No organomegaly or mass.  EXTREMITIES: No pedal edema, cyanosis, or clubbing.  NEUROLOGIC: Cranial nerves II through XII are intact. Muscle strength 4/5 in all extremities. Sensation intact. Gait not checked.  PSYCHIATRIC: The patient is drowsy today. SKIN: No obvious rash, lesion, or ulcer.   Physical Exam LABORATORY PANEL:   CBC Recent Labs  Lab 09/13/18 0452  WBC 9.2  HGB 13.9  HCT 38.7*  PLT 224   ------------------------------------------------------------------------------------------------------------------  Chemistries  Recent Labs  Lab 09/12/18 0554 09/13/18 0452  NA 140 137  K 3.7 3.6  CL 104 104  CO2 27 26  GLUCOSE 137* 116*  BUN 27* 17  CREATININE 1.10 0.96  CALCIUM 8.7* 8.9  AST 46*  --   ALT 68*  --   ALKPHOS 56  --   BILITOT 1.8*  --    ------------------------------------------------------------------------------------------------------------------  Cardiac Enzymes Recent Labs  Lab 09/12/18 0554  TROPONINI <0.03   ------------------------------------------------------------------------------------------------------------------  RADIOLOGY:  Dg Chest 2 View  Result Date: 09/13/2018 CLINICAL DATA:  COPD exacerbation. EXAM: CHEST - 2 VIEW COMPARISON:  Radiographs yesterday FINDINGS: Again seen hyperinflation. No focal airspace disease. Unchanged heart size and mediastinal contours. No pleural effusion or pneumothorax. No acute osseous abnormalities. IMPRESSION: Unchanged hyperinflation.  No acute abnormality. Electronically Signed   By: Narda Rutherford M.D.   On: 09/13/2018 07:01    ASSESSMENT AND PLAN:   Principal Problem:   Acute respiratory failure with hypoxia (HCC) Active Problems:   COPD exacerbation (HCC)   Acute respiratory failure (HCC)  *Acute respiratory failure with hypoxia COPD exacerbation  IV and inhaled steroids, nebulizer therapy, on admission BiPAP use. Improved, stop IV steroids, on room air now.  *  Drug withdrawal  Multi drug abuse   IV ativan PRN, Psych consult.   Monitor on tele.   Drowsy now, decreased ativan dose.  *Anxiety Give Ativan.  *Post traumatic stress disorder Continue Seroquel.  * Sinus tachycardia   Cardizem oral. Likely due to withdrawal.   Stable now.   All the records are reviewed and case discussed with Care Management/Social Workerr. Management plans discussed with the patient, family and they are in agreement.  CODE STATUS: Full.  TOTAL TIME TAKING CARE OF THIS PATIENT:45 minutes.    POSSIBLE D/C IN 2-3 DAYS, DEPENDING ON CLINICAL CONDITION.   Altamese Dilling M.D on 09/14/2018   Between 7am to 6pm - Pager - [phone]  After 6pm go to [email or web address] - password [email or web address]  [facility]  Office  [phone]  CC: Primary care physician; Patient, No Pcp Per  Note: This dictation was prepared with Dragon dictation along with smaller phrase technology. Any transcriptional errors that result from this process are unintentional.

## 2018-09-15 MED ORDER — ALBUTEROL SULFATE HFA 108 (90 BASE) MCG/ACT IN AERS: 2.0000 | INHALATION_SPRAY | Freq: Four times a day (QID) | RESPIRATORY_TRACT | 0 refills | Status: DC | PRN

## 2018-09-15 MED ORDER — GUAIFENESIN-CODEINE 100-10 MG/5ML PO SOLN: 5.0000 mL | Freq: Four times a day (QID) | ORAL | 0 refills | Status: DC | PRN

## 2018-09-15 MED ORDER — CLONAZEPAM 0.5 MG PO TABS: 0.5000 mg | ORAL_TABLET | Freq: Three times a day (TID) | ORAL | 0 refills | Status: DC

## 2018-09-15 MED ORDER — TIOTROPIUM BROMIDE MONOHYDRATE 18 MCG IN CAPS: 18.0000 ug | ORAL_CAPSULE | Freq: Every morning | RESPIRATORY_TRACT | 12 refills | Status: DC

## 2018-09-15 MED ORDER — OXYCODONE-ACETAMINOPHEN 5-325 MG PO TABS: 1.0000 | ORAL_TABLET | ORAL | 0 refills | Status: DC | PRN

## 2018-09-15 NOTE — Discharge Instructions (Signed)
Strongly advised against drug abuse and follow at [facility] 

## 2018-09-15 NOTE — Care Management Important Message (Signed)
Copy of signed IM left with patient in room.  

## 2018-09-15 NOTE — Progress Notes (Signed)
Discharged to home with a friend.  AVS  Reviewed with patients.  His short term memory is poor so I highlighted important points, such as risks and symptoms of overdose.

## 2018-09-15 NOTE — Plan of Care (Signed)
  Problem: Coping: Goal: Level of anxiety will decrease Outcome: Not Progressing Note:  Patient has been agitated/anxious throughout the shift. PRN medications given with no relief per patient.

## 2018-09-15 NOTE — Progress Notes (Signed)
Patient very agitated throughout the night, has been yelling at staff. Patient states that he will be writing a report stating that hospital staff left him sitting in his urine for more than 24 hours. This RN nor has other staff has noted any occurences of incontinence. Patient has been using urinal at the bedside. This RN attempted to show the patient how to use the phone to call for assistance, patient refuses to use phone states " I know how to call yall if I want yall".   Update: Patient called for pain medication. This RN explained to patient that it was not time for medication based on doctors orders. Patient visibly upset, doesn't understand why he cant have medication since it is PRN. RN attempted to explain why he was unable to receive the medication but the patient cut the nurse of mid sentence and began yelling about unrelated events. Patient has been asleep but arousable throughout the shift. Has not been in any distress.Will continue to monitor.  Mayra Neer M

## 2018-09-15 NOTE — Care Management (Signed)
RNCM to assess patient and he has already left hospital.

## 2018-09-18 NOTE — Discharge Summary (Signed)
[facility] [place] at [facility]   PATIENT NAME: Reginald Hopkins    MR#:  [id number]  DATE OF BIRTH:  07-Jun-1976  DATE OF ADMISSION:  09/12/2018 ADMITTING PHYSICIAN: Altamese Dilling, MD  DATE OF DISCHARGE: 09/15/2018  1:44 PM  PRIMARY CARE PHYSICIAN: Patient, No Pcp Per    ADMISSION DIAGNOSIS:  COPD exacerbation (HCC) [J44.1] Polysubstance abuse (HCC) [F19.10] Acute respiratory failure with hypoxia and hypercapnia (HCC) [J96.01, J96.02]  DISCHARGE DIAGNOSIS:  Principal Problem:   Acute respiratory failure with hypoxia (HCC) Active Problems:   PTSD (post-traumatic stress disorder)   COPD exacerbation (HCC)   Acute respiratory failure (HCC)   Amphetamine abuse (HCC)   Opiate abuse, episodic (HCC)   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 Anxiety   . Back pain   . CHF (congestive heart failure) (HCC)   . COPD (chronic obstructive pulmonary disease) (HCC)   . PTSD (post-traumatic stress disorder)     HOSPITAL COURSE:   *Acute respiratory failure with hypoxia COPD exacerbation  IV and inhaled steroids, nebulizer therapy, on admission BiPAP use. Improved, stop IV steroids, on room air now.  * Drug withdrawal   Multi drug abuse   IV ativan PRN, Psych consult.   Monitor on tele.   Drowsy now, decreased ativan dose.   Fully alet the next day.  *Anxiety Give Ativan.  *Post traumatic stress disorder Continue Seroquel.  * Sinus tachycardia   Cardizem oral. Likely due to withdrawal.   Stable now.   DISCHARGE CONDITIONS:   Stable.  CONSULTS OBTAINED:  Treatment Team:  Audery Amel, MD  DRUG ALLERGIES:  No Known Allergies  DISCHARGE MEDICATIONS:   Allergies as of 09/15/2018   No Known Allergies     Medication List    STOP taking these medications   ipratropium-albuterol 0.5-2.5 (3) MG/3ML Soln Commonly known as:  DUONEB   lamoTRIgine 25 MG  tablet Commonly known as:  LAMICTAL   Nebulizer Devi   QUEtiapine 200 MG tablet Commonly known as:  SEROQUEL   tamsulosin 0.4 MG Caps capsule Commonly known as:  FLOMAX     TAKE these medications   albuterol 108 (90 Base) MCG/ACT inhaler Commonly known as:  PROVENTIL HFA;VENTOLIN HFA Inhale 2 puffs into the lungs every 6 (six) hours as needed for wheezing or shortness of breath.   clonazePAM 0.5 MG tablet Commonly known as:  KLONOPIN Take 1 tablet (0.5 mg total) by mouth 3 (three) times daily.   guaiFENesin-codeine 100-10 MG/5ML syrup Take 5 mLs by mouth every 6 (six) hours as needed for cough.   LYRICA 300 MG capsule Generic drug:  pregabalin Take 300 mg by mouth 2 (two) times daily.   oxyCODONE-acetaminophen 5-325 MG tablet Commonly known as:  PERCOCET/ROXICET Take 1 tablet by mouth every 4 (four) hours as needed for severe pain.   tiotropium 18 MCG inhalation capsule Commonly known as:  SPIRIVA Place 1 capsule (18 mcg total) into inhaler and inhale every morning.        DISCHARGE INSTRUCTIONS:    Follow with PMD in 1-2 weeks.  If you experience worsening of your admission symptoms, develop shortness of breath, life threatening emergency, suicidal or homicidal thoughts you must seek medical attention immediately by calling 911 or calling your MD immediately  if symptoms less severe.  You Must read complete instructions/literature along with all the possible adverse reactions/side effects for all the Medicines you take  and that have been prescribed to you. Take any new Medicines after you have completely understood and accept all the possible adverse reactions/side effects.   Please note  You were cared for by a hospitalist during your hospital stay. If you have any questions about your discharge medications or the care you received while you were in the hospital after you are discharged, you can call the unit and asked to speak with the hospitalist on call if the  hospitalist that took care of you is not available. Once you are discharged, your primary care physician will handle any further medical issues. Please note that NO REFILLS for any discharge medications will be authorized once you are discharged, as it is imperative that you return to your primary care physician (or establish a relationship with a primary care physician if you do not have one) for your aftercare needs so that they can reassess your need for medications and monitor your lab values.    Today   CHIEF COMPLAINT:  No chief complaint on file.   HISTORY OF PRESENT ILLNESS:  Reginald Hopkins  is a 42 y.o. male with a known history of COPD, CHF, posttraumatic stress disorder, drug abuse-and out of his inhalers and COPD medications for last few days and was feeling very short of breath last night.  He tried snorting methamphetamine, but it did not help and patient continued to feel significantly short of breath so finally called EMS and came to emergency room. On arrival he was noted to have oxygen saturation up to 85% with minimal air movement on his lung exam so ER physician started on BiPAP after giving steroid injections and nebulizer therapy and called hospitalist service for admission. During my exam patient appeared comfortable on BiPAP, denied any fever or cough or sputum production for last few days.   VITAL SIGNS:  Blood pressure 128/88, pulse 82, temperature 97.8 F (36.6 C), temperature source Oral, resp. rate 18, height 5\' 11"  (1.803 m), weight 63.2 kg, SpO2 94 %.  I/O:  No intake or output data in the 24 hours ending 09/18/18 1637  PHYSICAL EXAMINATION:  GENERAL:  42 y.o.-year-old patient lying in the bed with no acute distress.  EYES: Pupils equal, round, reactive to light and accommodation. No scleral icterus. Extraocular muscles intact.  HEENT: Head atraumatic, normocephalic. Oropharynx and nasopharynx clear.  NECK:  Supple, no jugular venous distention. No thyroid  enlargement, no tenderness.  LUNGS: Normal breath sounds bilaterally, no wheezing, rales,rhonchi or crepitation. No use of accessory muscles of respiration.  CARDIOVASCULAR: S1, S2 normal. No murmurs, rubs, or gallops.  ABDOMEN: Soft, non-tender, non-distended. Bowel sounds present. No organomegaly or mass.  EXTREMITIES: No pedal edema, cyanosis, or clubbing.  NEUROLOGIC: Cranial nerves II through XII are intact. Muscle strength 5/5 in all extremities. Sensation intact. Gait not checked.  PSYCHIATRIC: The patient is alert and oriented x 3.  SKIN: No obvious rash, lesion, or ulcer.   DATA REVIEW:   CBC Recent Labs  Lab 09/13/18 0452  WBC 9.2  HGB 13.9  HCT 38.7*  PLT 224    Chemistries  Recent Labs  Lab 09/12/18 0554 09/13/18 0452  NA 140 137  K 3.7 3.6  CL 104 104  CO2 27 26  GLUCOSE 137* 116*  BUN 27* 17  CREATININE 1.10 0.96  CALCIUM 8.7* 8.9  AST 46*  --   ALT 68*  --   ALKPHOS 56  --   BILITOT 1.8*  --     Cardiac  Enzymes Recent Labs  Lab 09/12/18 0554  TROPONINI <0.03    Microbiology Results  Results for orders placed or performed during the hospital encounter of 09/12/18  MRSA PCR Screening     Status: None   Collection Time: 09/12/18  9:12 AM  Result Value Ref Range Status   MRSA by PCR NEGATIVE NEGATIVE Final    Comment:        The GeneXpert MRSA Assay (FDA approved for NASAL specimens only), is one component of a comprehensive MRSA colonization surveillance program. It is not intended to diagnose MRSA infection nor to guide or monitor treatment for MRSA infections. Performed at [facility], [address] [place], [place] [address]     RADIOLOGY:  No results found.  EKG:   Orders placed or performed during the hospital encounter of 09/12/18  . ED EKG  . EKG 12-Lead  . EKG 12-Lead  . ED EKG  . EKG 12-Lead  . EKG 12-Lead  . EKG      Management plans discussed with the patient, family and they are in  agreement.  CODE STATUS:  Code Status History    Date Active Date Inactive Code Status Order ID Comments User Context   09/12/2018 0906 09/15/2018 1723 Full Code [id number]  Altamese Dilling, MD Inpatient   02/10/2018 0802 02/10/2018 1910 Full Code [id number]  Milagros Loll, MD ED   03/25/2016 0227 03/25/2016 2121 Full Code [id number]  Kerry Hough, PA-C Inpatient      TOTAL TIME TAKING CARE OF THIS PATIENT: 35 minutes.    Altamese Dilling M.D on 09/18/2018 at 4:37 PM  Between 7am to 6pm - Pager - [phone]  After 6pm go to [email or web address] - password [email or web address]  [facility] [place] [facility]  Office  [phone]  CC: Primary care physician; Patient, No Pcp Per   Note: This dictation was prepared with Dragon dictation along with smaller phrase technology. Any transcriptional errors that result from this process are unintentional.

## 2018-11-15 ENCOUNTER — Emergency Department: Payer: Medicare Other

## 2018-11-15 ENCOUNTER — Emergency Department
Admission: EM | Admit: 2018-11-15 | Discharge: 2018-11-15 | Disposition: A | Discharge status: Home / Self Care | Payer: Medicare Other | Attending: Emergency Medicine | Admitting: Emergency Medicine

## 2018-11-15 ENCOUNTER — Encounter: Payer: Self-pay | Admitting: Emergency Medicine

## 2018-11-15 ENCOUNTER — Other Ambulatory Visit: Payer: Self-pay

## 2018-11-15 DIAGNOSIS — R200 Anesthesia of skin: Secondary | ICD-10-CM | POA: Insufficient documentation

## 2018-11-15 DIAGNOSIS — Z79899 Other long term (current) drug therapy: Secondary | ICD-10-CM | POA: Insufficient documentation

## 2018-11-15 DIAGNOSIS — M5440 Lumbago with sciatica, unspecified side: Secondary | ICD-10-CM | POA: Diagnosis not present

## 2018-11-15 DIAGNOSIS — Z87891 Personal history of nicotine dependence: Secondary | ICD-10-CM | POA: Insufficient documentation

## 2018-11-15 DIAGNOSIS — Z760 Encounter for issue of repeat prescription: Secondary | ICD-10-CM | POA: Insufficient documentation

## 2018-11-15 DIAGNOSIS — F1210 Cannabis abuse, uncomplicated: Secondary | ICD-10-CM | POA: Diagnosis not present

## 2018-11-15 DIAGNOSIS — R0781 Pleurodynia: Secondary | ICD-10-CM | POA: Diagnosis not present

## 2018-11-15 DIAGNOSIS — J449 Chronic obstructive pulmonary disease, unspecified: Secondary | ICD-10-CM | POA: Diagnosis not present

## 2018-11-15 DIAGNOSIS — R531 Weakness: Secondary | ICD-10-CM | POA: Insufficient documentation

## 2018-11-15 DIAGNOSIS — M545 Low back pain: Secondary | ICD-10-CM | POA: Diagnosis present

## 2018-11-15 DIAGNOSIS — F1410 Cocaine abuse, uncomplicated: Secondary | ICD-10-CM | POA: Diagnosis not present

## 2018-11-15 MED ORDER — ALBUTEROL SULFATE HFA 108 (90 BASE) MCG/ACT IN AERS: 2.0000 | INHALATION_SPRAY | Freq: Four times a day (QID) | RESPIRATORY_TRACT | 2 refills | Status: DC | PRN

## 2018-11-15 MED ORDER — LORAZEPAM 1 MG PO TABS
ORAL_TABLET | ORAL | Status: AC
  Filled 2018-11-15: qty 1

## 2018-11-15 MED ORDER — LORAZEPAM 1 MG PO TABS
1.0000 mg | ORAL_TABLET | Freq: Once | ORAL | Status: AC
  Administered 2018-11-15: 1 mg via ORAL

## 2018-11-15 MED ORDER — OXYCODONE-ACETAMINOPHEN 5-325 MG PO TABS: 1.0000 | ORAL_TABLET | ORAL | 0 refills | Status: DC | PRN

## 2018-11-15 MED ORDER — ONDANSETRON 4 MG PO TBDP
4.0000 mg | ORAL_TABLET | Freq: Once | ORAL | Status: AC
  Administered 2018-11-15: 4 mg via ORAL
  Filled 2018-11-15: qty 1

## 2018-11-15 MED ORDER — HYDROMORPHONE HCL 1 MG/ML IJ SOLN
0.5000 mg | Freq: Once | INTRAMUSCULAR | Status: AC
  Administered 2018-11-15: 0.5 mg via INTRAMUSCULAR
  Filled 2018-11-15: qty 1

## 2018-11-15 MED ORDER — KETOROLAC TROMETHAMINE 30 MG/ML IJ SOLN: 60.0000 mg | Freq: Once | INTRAMUSCULAR | Status: DC

## 2018-11-15 NOTE — Discharge Instructions (Signed)
Use the Percocet 1 pill 4 times a day as needed for pain.  Please follow-up with your regular doctor or see [facility] used to see before.  When you run out of the Percocet take the Motrin for the over-the-counter pills 3 times a day with food.  Do that for at least a week.  No more than 2 weeks.  MRI did not show any new problems and the chest x-ray looked okay

## 2018-11-15 NOTE — ED Notes (Signed)
Pt states that the pain medication helped but has still persisted. Pt calm and cooperative.

## 2018-11-15 NOTE — ED Triage Notes (Signed)
Pt arrives with complaints lower back pain. Pt states he has chronic back pain/problems. However, over the last few days pt states his pain has been debilitating and he cannot walk or perform ADLS. Pt stumbles when walking to triage room. Pt denies loss of continence. Pt in NAD

## 2018-11-15 NOTE — ED Provider Notes (Signed)
[facility] Emergency Department Provider Note   ____________________________________________   First MD Initiated Contact with Patient 11/15/18 1611     (approximate)  I have reviewed the triage vital signs and the nursing notes.   HISTORY  Chief Complaint Back Pain   HPI Otelia LimesJohn Thomas Gasparini is a 42 y.o. male who has a history of chronic back pain amphetamine and opiate abuse.  He comes in saying he cannot walk but I see him walking around the stretcher in 5 hallway.  He is walking as though he is uncomfortable with his back bent but he is walking. Review of his old CT scan shows that he does have an L1 compression fracture with some retropulsion.  Perhaps this is getting worse patient complains of increasing pain and weakness and numbness in his legs.  He also says he fell out of bed last night and has pain in his right lower to mid chest.  He does have a lump there where he broke his ribs 2 weeks ago that is somewhat tender perhaps she reactivated that.  Also asked for refill for his rescue inhaler albuterol which is empty.  I have given him that.  Pain seems to be chronic and increasing he says his whole body hurts.  He also said that he has a fracture in his right fifth metacarpal with plates and screws in it that gives him problems as well.  He has seen [facility] repeatedly in the past and asked for referral back there.   Past Medical History:  Diagnosis Date  . Anxiety   . Back pain   . CHF (congestive heart failure) (HCC)   . COPD (chronic obstructive pulmonary disease) (HCC)   . PTSD (post-traumatic stress disorder)     Patient Active Problem List   Diagnosis Date Noted  . 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 Opiate abuse, episodic (HCC) 09/14/2018  . 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 Acute respiratory failure with hypoxia (HCC) 09/12/2018  . Acute respiratory failure (HCC) 09/12/2018  . COPD exacerbation (HCC)  02/10/2018  . 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 Explosive personality disorder (HCC)   . Bipolar 2 disorder, major depressive episode (HCC)   . GAD (generalized anxiety disorder)   . Substance induced mood disorder (HCC) 03/24/2016  . Cocaine abuse (HCC) 03/24/2016  . PTSD (post-traumatic stress disorder) 03/24/2016  . COPD (chronic obstructive pulmonary disease) (HCC) 03/24/2016  . Antisocial personality disorder (HCC) 03/24/2016    Past Surgical History:  Procedure Laterality Date  . BACK SURGERY      Prior to Admission medications   Medication Sig Start Date End Date Taking? Authorizing Provider  albuterol (PROVENTIL HFA;VENTOLIN HFA) 108 (90 Base) MCG/ACT inhaler Inhale 2 puffs into the lungs every 6 (six) hours as needed for wheezing or shortness of breath. 09/15/18   Altamese DillingVachhani, Vaibhavkumar, MD  albuterol (PROVENTIL HFA;VENTOLIN HFA) 108 (90 Base) MCG/ACT inhaler Inhale 2 puffs into the lungs every 6 (six) hours as needed for wheezing or shortness of breath. 11/15/18   Arnaldo NatalMalinda, Diamantina Edinger F, MD  clonazePAM (KLONOPIN) 0.5 MG tablet Take 1 tablet (0.5 mg total) by mouth 3 (three) times daily. 09/15/18   Altamese DillingVachhani, Vaibhavkumar, MD  guaiFENesin-codeine 100-10 MG/5ML syrup Take 5 mLs by mouth every 6 (six) hours as needed for cough. 09/15/18   Altamese DillingVachhani, Vaibhavkumar, MD  LYRICA 300 MG capsule Take 300 mg by mouth 2 (two) times daily. 05/26/18  [provider]  oxyCODONE-acetaminophen (PERCOCET) 5-325 MG tablet Take 1 tablet by mouth every 4 (four) hours as needed for severe pain. 09/15/18   Altamese Dilling, MD  oxyCODONE-acetaminophen (PERCOCET) 5-325 MG tablet Take 1 tablet by mouth every 4 (four) hours as needed for severe pain. 11/15/18 11/15/19  Arnaldo Natal, MD  tiotropium (SPIRIVA) 18 MCG inhalation capsule Place 1 capsule (18 mcg total) into inhaler and inhale every morning. 09/16/18   Altamese Dilling, MD     Allergies Patient has no known allergies.  Family History  Problem Relation Age of Onset  . Hypertension Father     Social History Social History   Tobacco Use  . Smoking status: Former Games developer  . Smokeless tobacco: Never Used  Substance Use Topics  . Alcohol use: Yes  . Drug use: Yes    Types: Cocaine, Marijuana    Review of Systems  Constitutional: No fever/chills Eyes: No visual changes. ENT: No sore throat. Cardiovascular:chest pain.  This is with exertion or deep breathing Respiratory: Chronic shortness of breath. Gastrointestinal: No abdominal pain.  No nausea, no vomiting.  No diarrhea.  No constipation. Genitourinary: Negative for dysuria. Musculoskeletal:back pain. Skin: Negative for rash. Neurological: Negative for headaches  ____________________________________________   PHYSICAL EXAM:  VITAL SIGNS: ED Triage Vitals  Enc Vitals Group     BP 11/15/18 1353 103/78     Pulse Rate 11/15/18 1353 (!) 102     Resp 11/15/18 1353 18     Temp 11/15/18 1353 98.3 F (36.8 C)     Temp Source 11/15/18 1353 Oral     SpO2 11/15/18 1353 97 %     Weight 11/15/18 1354 139 lb 5.3 oz (63.2 kg)     Height 11/15/18 1354 5\' 11"  (1.803 m)     Head Circumference --      Peak Flow --      Pain Score 11/15/18 1353 10     Pain Loc --      Pain Edu? --      Excl. in GC? --     Constitutional: Alert and oriented. Well appearing and in no acute distress. Eyes: Conjunctivae are normal.  Head: Atraumatic. Nose: No congestion/rhinnorhea. Mouth/Throat: Mucous membranes are moist.  Oropharynx non-erythematous. Neck: No stridor .Cardiovascular: Normal rate, regular rhythm. Grossly normal heart sounds.  Good peripheral circulation. Respiratory: Normal respiratory effort.  No retractions. Lungs CTAB.  Some right-sided rib tenderness is not severe Gastrointestinal: Soft and nontender. No distention. No abdominal bruits. No CVA tenderness. Musculoskeletal: No lower extremity  tenderness nor edema. Neurologic:  Normal speech and language. No gross focal neurologic deficits are appreciated.  Straight leg raise causes some pain in the posterior thighs 90 degrees I cannot find any weakness on exam patient is not complaining of numbness right now.  DTRs are 1+ in both knees and ankles. Skin:  Skin is warm, dry and intact. Psychiatric: Mood and affect are normal. Speech and behavior are normal.  ____________________________________________   LABS (all labs ordered are listed, but only abnormal results are displayed)  Labs Reviewed - No data to display ____________________________________________  EKG   ____________________________________________  RADIOLOGY  ED MD interpretation: MRI shows no acute pathology nothing that really explain patient's pain Chest x-ray shows a spot on the left side which may be a nipple marker.  We will repeat the chest x-ray at radiologist suggestion Chest x-ray initially showed a possible shadow versus nipple marker repeat film with nipple marker shows that it is  a nipple Official radiology report(s): Dg Chest 1 View  Result Date: 11/15/2018 CLINICAL DATA:  42 year old male with history of congestive heart failure. COPD. EXAM: CHEST  1 VIEW COMPARISON:  Chest x-ray 11/15/2018. FINDINGS: Lung volumes are increased. Emphysematous changes. No acute consolidative airspace disease. No pleural effusions. Bilateral nipple markers are in place, and the left nipple marker is coincident over the previously described nodule, indicative of a nipple shadow. No other suspicious appearing pulmonary nodules or masses are noted. No evidence of pulmonary edema. Heart size is normal. Upper mediastinal contours are within normal limits. IMPRESSION: 1. Previously suspected left-sided lung nodule corresponds to a nipple shadow. This is a benign finding. 2. Emphysematous changes redemonstrated. Electronically Signed   By: Trudie Reed M.D.   On: 11/15/2018  19:06   Dg Chest 2 View  Result Date: 11/15/2018 CLINICAL DATA:  Low back pain. Fall 2 weeks ago. Pain in the lower right ribs. EXAM: CHEST - 2 VIEW COMPARISON:  September 13, 2018 FINDINGS: Biapical bulla, left greater than right. Hyperinflation of the lungs. Nodular density over the left base, likely a nipple shadow. No other nodules or masses. The cardiomediastinal silhouette is unremarkable. Blunting of the costophrenic angles may represent small effusions versus pleural thickening, stable. No other changes. Anterior wedging of a lower thoracic vertebral body is unchanged since September 13, 2018. IMPRESSION: 1. Biapical bulla. Emphysematous changes in the lungs with hyperinflation. 2. A nodule over the left lung base is probably a nipple shadow. Recommend repeat imaging with nipple marker. 3. Blunting of the costophrenic angles is stable and could represent tiny effusions versus pleural thickening. 4. Nonacute compression fracture of a lower thoracic vertebral body. Electronically Signed   By: Gerome Sam III M.D   On: 11/15/2018 16:53   Mr Lumbar Spine Wo Contrast  Result Date: 11/15/2018 CLINICAL DATA:  42 y/o  M; severe lower back pain. EXAM: MRI LUMBAR SPINE WITHOUT CONTRAST TECHNIQUE: Multiplanar, multisequence MR imaging of the lumbar spine was performed. No intravenous contrast was administered. COMPARISON:  06/20/2018 CT abdomen and pelvis. FINDINGS: Segmentation:  Standard. Alignment:  Physiologic. Vertebrae: No fracture, evidence of discitis, or bone lesion. Chronic L1 anterior compression deformity. Mild degenerative inferior endplate edema within the right aspect of L5 vertebral body. Conus medullaris and cauda equina: Conus extends to the upper L2 level. Conus and cauda equina appear normal. Paraspinal and other soft tissues: Negative. Disc levels: Disc desiccation with loss of intervertebral disc space height at T12-L3 and L5-S1. T12-L1: Minimal L1 bony retropulsion and mild disc  displacement. No significant foraminal or canal stenosis. L1-2: Minimal L1 bony retropulsion and mild disc displacement. No significant foraminal or canal stenosis. L2-3:  Mild disc bulge.  No significant foraminal or canal stenosis. L3-4:  Mild disc bulge.  No significant foraminal or canal stenosis. L4-5: No significant disc displacement, foraminal stenosis, or canal stenosis. L5-S1: Small central disc protrusion with annular fissure, endplate marginal osteophytes eccentric to the right foraminal zone, and mild facet hypertrophy. Mild right neural foraminal and bilateral recess stenosis. No significant canal stenosis. IMPRESSION: 1. No acute osseous abnormality or malalignment. Stable chronic L1 compression deformity. 2. Mild multilevel discogenic degenerative changes greatest at L5-S1. 3. L5-S1 mild right foraminal and bilateral recess stenosis. Electronically Signed   By: Mitzi Hansen M.D.   On: 11/15/2018 17:31    ____________________________________________   PROCEDURES  Procedure(s) performed:   Procedures  Critical Care performed:   ____________________________________________   INITIAL IMPRESSION / ASSESSMENT AND PLAN / ED  COURSE   Patient has right rib pain and a palpable lump where the rib seems to be irregular.  He says he fell 2 weeks ago and hurt it and fell out of bed yesterday.  I will give him some pain medicine for a few days and switch to Motrin.      ____________________________________________   FINAL CLINICAL IMPRESSION(S) / ED DIAGNOSES  Final diagnoses:  Low back pain with sciatica, sciatica laterality unspecified, unspecified back pain laterality, unspecified chronicity  Rib pain on right side     ED Discharge Orders         Ordered    albuterol (PROVENTIL HFA;VENTOLIN HFA) 108 (90 Base) MCG/ACT inhaler  Every 6 hours PRN     11/15/18 1624    oxyCODONE-acetaminophen (PERCOCET) 5-325 MG tablet  Every 4 hours PRN     11/15/18 1914            Note:  This document was prepared using Dragon voice recognition software and may include unintentional dictation errors.    Arnaldo Natal, MD 11/15/18 [id number]

## 2018-12-12 ENCOUNTER — Emergency Department
Admission: EM | Admit: 2018-12-12 | Discharge: 2018-12-12 | Disposition: A | Discharge status: Home / Self Care | Payer: Medicare Other | Attending: Emergency Medicine | Admitting: Emergency Medicine

## 2018-12-12 ENCOUNTER — Other Ambulatory Visit: Payer: Self-pay

## 2018-12-12 ENCOUNTER — Emergency Department: Payer: Medicare Other

## 2018-12-12 DIAGNOSIS — Z87891 Personal history of nicotine dependence: Secondary | ICD-10-CM | POA: Diagnosis not present

## 2018-12-12 DIAGNOSIS — F1410 Cocaine abuse, uncomplicated: Secondary | ICD-10-CM | POA: Insufficient documentation

## 2018-12-12 DIAGNOSIS — J449 Chronic obstructive pulmonary disease, unspecified: Secondary | ICD-10-CM | POA: Insufficient documentation

## 2018-12-12 DIAGNOSIS — F4325 Adjustment disorder with mixed disturbance of emotions and conduct: Secondary | ICD-10-CM | POA: Diagnosis not present

## 2018-12-12 DIAGNOSIS — N200 Calculus of kidney: Secondary | ICD-10-CM | POA: Insufficient documentation

## 2018-12-12 DIAGNOSIS — Z79899 Other long term (current) drug therapy: Secondary | ICD-10-CM | POA: Diagnosis not present

## 2018-12-12 DIAGNOSIS — I509 Heart failure, unspecified: Secondary | ICD-10-CM | POA: Insufficient documentation

## 2018-12-12 DIAGNOSIS — F419 Anxiety disorder, unspecified: Secondary | ICD-10-CM | POA: Insufficient documentation

## 2018-12-12 DIAGNOSIS — R1030 Lower abdominal pain, unspecified: Secondary | ICD-10-CM | POA: Diagnosis present

## 2018-12-12 DIAGNOSIS — F1110 Opioid abuse, uncomplicated: Secondary | ICD-10-CM | POA: Diagnosis not present

## 2018-12-12 LAB — BASIC METABOLIC PANEL
ANION GAP: 7 (ref 5–15)
BUN: 17 mg/dL (ref 6–20)
CO2: 27 mmol/L (ref 22–32)
Calcium: 9.2 mg/dL (ref 8.9–10.3)
Chloride: 104 mmol/L (ref 98–111)
Creatinine, Ser: 1.06 mg/dL (ref 0.61–1.24)
GFR calc Af Amer: >60 mL/min (ref >60)
Glucose, Bld: 120 mg/dL — ABNORMAL HIGH (ref 70–99)
POTASSIUM: 3.9 mmol/L (ref 3.5–5.1)
SODIUM: 138 mmol/L (ref 135–145)

## 2018-12-12 LAB — CBC
HCT: 49.1 % (ref 39.0–52.0)
HEMOGLOBIN: 16.2 g/dL (ref 13.0–17.0)
MCH: 29.1 pg (ref 26.0–34.0)
MCHC: 33 g/dL (ref 30.0–36.0)
MCV: 88.3 fL (ref 80.0–100.0)
Platelets: 321 10*3/uL (ref 150–400)
RBC: 5.56 MIL/uL (ref 4.22–5.81)
RDW: 12.8 % (ref 11.5–15.5)
WBC: 7 10*3/uL (ref 4.0–10.5)
nRBC: 0 % (ref 0.0–0.2)

## 2018-12-12 LAB — URINALYSIS, COMPLETE (UACMP) WITH MICROSCOPIC
Bacteria, UA: NONE SEEN
Bilirubin Urine: NEGATIVE
Glucose, UA: NEGATIVE mg/dL
Ketones, ur: NEGATIVE mg/dL
Leukocytes, UA: NEGATIVE
Nitrite: NEGATIVE
Protein, ur: NEGATIVE mg/dL
RBC / HPF: >50 RBC/hpf — ABNORMAL HIGH (ref 0–5)
Specific Gravity, Urine: 1.019 (ref 1.005–1.030)
Squamous Epithelial / HPF: NONE SEEN (ref 0–5)
pH: 6 (ref 5.0–8.0)

## 2018-12-12 MED ORDER — OXYCODONE-ACETAMINOPHEN 5-325 MG PO TABS: 1.0000 | ORAL_TABLET | ORAL | 0 refills | Status: DC | PRN

## 2018-12-12 MED ORDER — SODIUM CHLORIDE 0.9 % IV BOLUS
1000.0000 mL | Freq: Once | INTRAVENOUS | Status: AC
  Administered 2018-12-12: 1000 mL via INTRAVENOUS

## 2018-12-12 MED ORDER — ONDANSETRON HCL 4 MG/2ML IJ SOLN
4.0000 mg | Freq: Once | INTRAMUSCULAR | Status: AC
  Administered 2018-12-12: 4 mg via INTRAVENOUS
  Filled 2018-12-12: qty 2

## 2018-12-12 MED ORDER — MORPHINE SULFATE (PF) 4 MG/ML IV SOLN
4.0000 mg | Freq: Once | INTRAVENOUS | Status: AC
  Administered 2018-12-12: 4 mg via INTRAVENOUS
  Filled 2018-12-12: qty 1

## 2018-12-12 MED ORDER — TAMSULOSIN HCL 0.4 MG PO CAPS: 0.4000 mg | ORAL_CAPSULE | Freq: Every day | ORAL | 0 refills | Status: DC

## 2018-12-12 MED ORDER — KETOROLAC TROMETHAMINE 30 MG/ML IJ SOLN
30.0000 mg | Freq: Once | INTRAMUSCULAR | Status: DC
  Filled 2018-12-12: qty 1

## 2018-12-12 MED ORDER — TAMSULOSIN HCL 0.4 MG PO CAPS
0.4000 mg | ORAL_CAPSULE | Freq: Once | ORAL | Status: AC
  Administered 2018-12-12: 0.4 mg via ORAL
  Filled 2018-12-12: qty 1

## 2018-12-12 MED ORDER — ALBUTEROL SULFATE HFA 108 (90 BASE) MCG/ACT IN AERS: 2.0000 | INHALATION_SPRAY | Freq: Four times a day (QID) | RESPIRATORY_TRACT | 2 refills | Status: DC | PRN

## 2018-12-12 MED ORDER — OXYCODONE-ACETAMINOPHEN 5-325 MG PO TABS
2.0000 | ORAL_TABLET | Freq: Once | ORAL | Status: AC
  Administered 2018-12-12: 2 via ORAL
  Filled 2018-12-12: qty 2

## 2018-12-12 NOTE — ED Triage Notes (Signed)
Pt c/o Bl flank and lower abd pain with hematuria for the past week , states he was recently dx with kidney stones but does not think they have passed yet.

## 2018-12-12 NOTE — ED Provider Notes (Signed)
[facility] Emergency Department Provider Note  Time seen: 8:51 PM  I have reviewed the triage vital signs and the nursing notes.   HISTORY  Chief Complaint Flank Pain and Hematuria    HPI Reginald Hopkins is a 42 y.o. male with a past medical history of anxiety, chronic pain, CHF, COPD, PTSD, presents to the emergency department for bilateral flank pain.  According to the patient over the past 3 days he has been experiencing intermittent sharp severe pain in the bilateral flanks radiating down to his lower abdomen.  States 2 days ago he saw blood in his urine, he went away yesterday but came back again today.  Denies any dysuria.  Denies any fever.  Patient states a history of kidney stones in the past and believes he possibly has a kidney stone currently.  Describes his pain as moderate to severe sharp pain mostly in the bilateral flank/back.   Past Medical History:  Diagnosis Date  . Anxiety   . Back pain   . CHF (congestive heart failure) (HCC)   . COPD (chronic obstructive pulmonary disease) (HCC)   . PTSD (post-traumatic stress disorder)     Patient Active Problem List   Diagnosis Date Noted  . 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 Opiate abuse, episodic (HCC) 09/14/2018  . 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 Acute respiratory failure with hypoxia (HCC) 09/12/2018  . Acute respiratory failure (HCC) 09/12/2018  . COPD exacerbation (HCC) 02/10/2018  . 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 Explosive personality disorder (HCC)   . Bipolar 2 disorder, major depressive episode (HCC)   . GAD (generalized anxiety disorder)   . Substance induced mood disorder (HCC) 03/24/2016  . Cocaine abuse (HCC) 03/24/2016  . PTSD (post-traumatic stress disorder) 03/24/2016  . COPD (chronic obstructive pulmonary disease) (HCC) 03/24/2016  . Antisocial personality disorder (HCC) 03/24/2016    Past Surgical  History:  Procedure Laterality Date  . BACK SURGERY      Prior to Admission medications   Medication Sig Start Date End Date Taking? Authorizing Provider  albuterol (PROVENTIL HFA;VENTOLIN HFA) 108 (90 Base) MCG/ACT inhaler Inhale 2 puffs into the lungs every 6 (six) hours as needed for wheezing or shortness of breath. 09/15/18   Altamese DillingVachhani, Vaibhavkumar, MD  albuterol (PROVENTIL HFA;VENTOLIN HFA) 108 (90 Base) MCG/ACT inhaler Inhale 2 puffs into the lungs every 6 (six) hours as needed for wheezing or shortness of breath. 11/15/18   Arnaldo NatalMalinda, Paul F, MD  clonazePAM (KLONOPIN) 0.5 MG tablet Take 1 tablet (0.5 mg total) by mouth 3 (three) times daily. 09/15/18   Altamese DillingVachhani, Vaibhavkumar, MD  guaiFENesin-codeine 100-10 MG/5ML syrup Take 5 mLs by mouth every 6 (six) hours as needed for cough. 09/15/18   Altamese DillingVachhani, Vaibhavkumar, MD  LYRICA 300 MG capsule Take 300 mg by mouth 2 (two) times daily. 05/26/18   [provider]  oxyCODONE-acetaminophen (PERCOCET) 5-325 MG tablet Take 1 tablet by mouth every 4 (four) hours as needed for severe pain. 09/15/18   Altamese DillingVachhani, Vaibhavkumar, MD  oxyCODONE-acetaminophen (PERCOCET) 5-325 MG tablet Take 1 tablet by mouth every 4 (four) hours as needed for severe pain. 11/15/18 11/15/19  Arnaldo NatalMalinda, Paul F, MD  tiotropium (SPIRIVA) 18 MCG inhalation capsule Place 1 capsule (18 mcg total) into inhaler and inhale every morning. 09/16/18   Altamese DillingVachhani, Vaibhavkumar, MD    No Known Allergies  Family History  Problem Relation Age of Onset  . Hypertension Father  Social History Social History   Tobacco Use  . Smoking status: Former Games developermoker  . Smokeless tobacco: Never Used  Substance Use Topics  . Alcohol use: Yes  . Drug use: Yes    Types: Cocaine, Marijuana    Review of Systems Constitutional: Negative for fever. Cardiovascular: Negative for chest pain. Respiratory: Negative for shortness of breath. Gastrointestinal: Bilateral flank pain.  Positive for nausea but  denies any vomiting. Genitourinary: Positive for hematuria.  Negative for dysuria. Musculoskeletal: Negative for musculoskeletal complaints Neurological: Negative for headache All other ROS negative  ____________________________________________   PHYSICAL EXAM:  VITAL SIGNS: ED Triage Vitals  Enc Vitals Group     BP 12/12/18 1827 121/75     Pulse Rate 12/12/18 1827 99     Resp 12/12/18 1827 18     Temp 12/12/18 1827 97.7 F (36.5 C)     Temp Source 12/12/18 1827 Oral     SpO2 12/12/18 1827 100 %     Weight 12/12/18 1828 150 lb (68 kg)     Height 12/12/18 1828 5\' 11"  (1.803 m)     Head Circumference --      Peak Flow --      Pain Score 12/12/18 1828 10     Pain Loc --      Pain Edu? --      Excl. in GC? --    Constitutional: Alert and oriented. Well appearing and in no distress. Eyes: Normal exam ENT   Head: Normocephalic and atraumatic.   Mouth/Throat: Mucous membranes are moist. Cardiovascular: Normal rate, regular rhythm. No murmur Respiratory: Normal respiratory effort without tachypnea nor retractions. Breath sounds are clear  Gastrointestinal: Soft and nontender. No distention.  Minimal bilateral CVA tenderness. Musculoskeletal: Nontender with normal range of motion in all extremities. Neurologic:  Normal speech and language. No gross focal neurologic deficits  Skin:  Skin is warm, dry and intact.  Psychiatric: Mood and affect are normal.  ____________________________________________    RADIOLOGY  CT consistent with 7 mm left UPJ stone.  ____________________________________________   INITIAL IMPRESSION / ASSESSMENT AND PLAN / ED COURSE  Pertinent labs & imaging results that were available during my care of the patient were reviewed by me and considered in my medical decision making (see chart for details).  Patient presents to the emergency department for bilateral flank pain over the past 3 days occurring intermittently along with intermittent  hematuria.  Differential would include musculoskeletal pain, exacerbation of chronic back pain, ureterolithiasis, UTI, pyelonephritis.  Patient's labs are resulted largely within normal limits besides hematuria which is most consistent with ureterolithiasis.  We will obtain CT imaging of the abdomen to further evaluate.  Patient agreeable to plan of care.  Patient also has a history of substance abuse unfortunately.  We will dose IV Toradol, fluids and Zofran and continue to closely monitor.  7 mm left UPJ stone.  Urine culture has been sent.  Lab work otherwise nonrevealing.  We will discharge with urology follow-up.  We will discharge with Flomax and Percocet.  Patient agreeable to plan of care.  I discussed my normal kidney stone return precautions.  ____________________________________________   FINAL CLINICAL IMPRESSION(S) / ED DIAGNOSES  Flank pain Hematuria kidney stone   Minna AntisPaduchowski, Nam Vossler, MD 12/12/18 2205

## 2018-12-12 NOTE — ED Notes (Signed)
Pt states he has kidney stones and he knows because he has had them before. Pt c/o pain to lower abd and flank bilaterally.

## 2018-12-14 LAB — URINE CULTURE

## 2018-12-20 ENCOUNTER — Ambulatory Visit
Admission: RE | Admit: 2018-12-20 | Discharge: 2018-12-20 | Disposition: A | Discharge status: Home / Self Care | Payer: Medicare Other | Source: Home / Self Care | Attending: Urology | Admitting: Urology

## 2018-12-20 ENCOUNTER — Encounter: Payer: Self-pay | Admitting: Urology

## 2018-12-20 ENCOUNTER — Ambulatory Visit
Admission: RE | Admit: 2018-12-20 | Discharge: 2018-12-20 | Disposition: A | Discharge status: Home / Self Care | Payer: Medicare Other | Source: Ambulatory Visit | Attending: Urology | Admitting: Urology

## 2018-12-20 ENCOUNTER — Encounter: Payer: Self-pay | Admitting: Emergency Medicine

## 2018-12-20 ENCOUNTER — Emergency Department
Admission: EM | Admit: 2018-12-20 | Discharge: 2018-12-20 | Disposition: A | Discharge status: Home / Self Care | Payer: Medicare Other | Attending: Emergency Medicine | Admitting: Emergency Medicine

## 2018-12-20 ENCOUNTER — Ambulatory Visit: Payer: Self-pay | Admitting: Urology

## 2018-12-20 ENCOUNTER — Ambulatory Visit (INDEPENDENT_AMBULATORY_CARE_PROVIDER_SITE_OTHER): Payer: Medicare Other | Admitting: Urology

## 2018-12-20 VITALS — BP 155/89 | HR 94 | Ht 71.0 in | Wt 150.0 lb

## 2018-12-20 DIAGNOSIS — F1410 Cocaine abuse, uncomplicated: Secondary | ICD-10-CM | POA: Diagnosis not present

## 2018-12-20 DIAGNOSIS — J449 Chronic obstructive pulmonary disease, unspecified: Secondary | ICD-10-CM | POA: Insufficient documentation

## 2018-12-20 DIAGNOSIS — F1210 Cannabis abuse, uncomplicated: Secondary | ICD-10-CM | POA: Diagnosis not present

## 2018-12-20 DIAGNOSIS — N200 Calculus of kidney: Secondary | ICD-10-CM

## 2018-12-20 DIAGNOSIS — N50812 Left testicular pain: Secondary | ICD-10-CM | POA: Insufficient documentation

## 2018-12-20 DIAGNOSIS — Z87891 Personal history of nicotine dependence: Secondary | ICD-10-CM | POA: Diagnosis not present

## 2018-12-20 DIAGNOSIS — N50811 Right testicular pain: Secondary | ICD-10-CM | POA: Insufficient documentation

## 2018-12-20 DIAGNOSIS — N201 Calculus of ureter: Secondary | ICD-10-CM | POA: Diagnosis not present

## 2018-12-20 DIAGNOSIS — R109 Unspecified abdominal pain: Secondary | ICD-10-CM | POA: Diagnosis present

## 2018-12-20 DIAGNOSIS — R1032 Left lower quadrant pain: Secondary | ICD-10-CM

## 2018-12-20 DIAGNOSIS — N50819 Testicular pain, unspecified: Secondary | ICD-10-CM

## 2018-12-20 LAB — BASIC METABOLIC PANEL
Anion gap: 7 (ref 5–15)
BUN: 15 mg/dL (ref 6–20)
CO2: 26 mmol/L (ref 22–32)
Calcium: 9.1 mg/dL (ref 8.9–10.3)
Chloride: 104 mmol/L (ref 98–111)
Creatinine, Ser: 1.14 mg/dL (ref 0.61–1.24)
GFR calc Af Amer: >60 mL/min (ref >60)
GFR calc non Af Amer: >60 mL/min (ref >60)
Glucose, Bld: 120 mg/dL — ABNORMAL HIGH (ref 70–99)
Potassium: 3.7 mmol/L (ref 3.5–5.1)
Sodium: 137 mmol/L (ref 135–145)

## 2018-12-20 LAB — CBC
HCT: 43.7 % (ref 39.0–52.0)
Hemoglobin: 14.6 g/dL (ref 13.0–17.0)
MCH: 29.1 pg (ref 26.0–34.0)
MCHC: 33.4 g/dL (ref 30.0–36.0)
MCV: 87.2 fL (ref 80.0–100.0)
NRBC: 0 % (ref 0.0–0.2)
Platelets: 239 10*3/uL (ref 150–400)
RBC: 5.01 MIL/uL (ref 4.22–5.81)
RDW: 12.5 % (ref 11.5–15.5)
WBC: 8.7 10*3/uL (ref 4.0–10.5)

## 2018-12-20 MED ORDER — HYDROMORPHONE HCL 1 MG/ML IJ SOLN
0.5000 mg | Freq: Once | INTRAMUSCULAR | Status: AC
  Administered 2018-12-20: 0.5 mg via INTRAVENOUS
  Filled 2018-12-20: qty 1

## 2018-12-20 MED ORDER — FENTANYL CITRATE (PF) 100 MCG/2ML IJ SOLN
50.0000 ug | Freq: Once | INTRAMUSCULAR | Status: AC
  Administered 2018-12-20: 50 ug via INTRAVENOUS
  Filled 2018-12-20: qty 2

## 2018-12-20 MED ORDER — SODIUM CHLORIDE 0.9 % IV BOLUS
1000.0000 mL | Freq: Once | INTRAVENOUS | Status: AC
  Administered 2018-12-20: 1000 mL via INTRAVENOUS

## 2018-12-20 MED ORDER — ONDANSETRON HCL 4 MG/2ML IJ SOLN
4.0000 mg | Freq: Once | INTRAMUSCULAR | Status: AC
  Administered 2018-12-20: 4 mg via INTRAVENOUS
  Filled 2018-12-20: qty 2

## 2018-12-20 MED ORDER — KETOROLAC TROMETHAMINE 60 MG/2ML IM SOLN
60.0000 mg | Freq: Once | INTRAMUSCULAR | Status: AC
  Administered 2018-12-20: 60 mg via INTRAMUSCULAR

## 2018-12-20 NOTE — ED Provider Notes (Signed)
[facility] Emergency Department Provider Note  ____________________________________________  Time seen: Approximately 10:20 AM  I have reviewed the triage vital signs and the nursing notes.   HISTORY  Chief Complaint Flank Pain and Urinary Retention    HPI Reginald Hopkins is a 43 y.o. male with a known 7 mm left UPJ stone diagnosed 12/12/2018 presenting with testicular pain, left lower quadrant pain and left flank pain.  The patient was supposed to have an appointment in Dr. Delana MeyerBrandon's office today to be evaluated for lithotripsy tomorrow.  He states that he canceled the appointment to come here because he "could not stand the pain."  He reports "it feels like someone is squeezing my balls."  He also describes a left lower quadrant pain and left flank pain that is similar to the pain he has been having but more severe.  He has taken all the Percocet that was prescribed to him and does not have any more pain medication.  He has not had any fevers or chills, nausea or vomiting, or any new symptoms.  Past Medical History:  Diagnosis Date  . Anxiety   . Back pain   . CHF (congestive heart failure) (HCC)   . COPD (chronic obstructive pulmonary disease) (HCC)   . PTSD (post-traumatic stress disorder)     Patient Active Problem List   Diagnosis Date Noted  . 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 Opiate abuse, episodic (HCC) 09/14/2018  . 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 Acute respiratory failure with hypoxia (HCC) 09/12/2018  . Acute respiratory failure (HCC) 09/12/2018  . COPD exacerbation (HCC) 02/10/2018  . 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 Explosive personality disorder (HCC)   . Bipolar 2 disorder, major depressive episode (HCC)   . GAD (generalized anxiety disorder)   . Substance induced mood disorder (HCC) 03/24/2016  . Cocaine abuse (HCC) 03/24/2016  . PTSD (post-traumatic stress  disorder) 03/24/2016  . COPD (chronic obstructive pulmonary disease) (HCC) 03/24/2016  . Antisocial personality disorder (HCC) 03/24/2016    Past Surgical History:  Procedure Laterality Date  . BACK SURGERY      Current Outpatient Rx  . Order #: [id number] Class: Print  . Order #: [id number] Class: Print  . Order #: [id number] Class: Print  . Order #: [id number] Class: Print  . Order #: [id number] Class: Historical Med  . Order #: [id number] Class: Print  . Order #: [id number] Class: Print  . Order #: [id number] Class: Normal    Allergies Patient has no known allergies.  Family History  Problem Relation Age of Onset  . Hypertension Father     Social History Social History   Tobacco Use  . Smoking status: Former Games developermoker  . Smokeless tobacco: Never Used  Substance Use Topics  . Alcohol use: Yes  . Drug use: Yes    Types: Cocaine, Marijuana    Review of Systems Constitutional: No fever/chills. Eyes: No visual changes. ENT: No sore throat. No congestion or rhinorrhea. Cardiovascular: Denies chest pain. Denies palpitations. Respiratory: Denies shortness of breath.  No cough. Gastrointestinal: Positive leftflank and left lower quadrant abdominal pain.  No nausea, no vomiting.  No diarrhea.  No constipation. Genitourinary: Negative for dysuria.  Positive bilateral testicular pain.  No hematuria. Musculoskeletal: Negative for back pain. Skin: Negative for rash. Neurological: Negative for headaches. No focal numbness, tingling or weakness.     ____________________________________________   PHYSICAL EXAM:  VITAL SIGNS: ED Triage Vitals [12/20/18 0851]  Enc Vitals Group  BP 135/80     Pulse Rate 88     Resp 20     Temp      Temp src      SpO2 99 %     Weight 150 lb (68 kg)     Height 5\' 11"  (1.803 m)     Head Circumference      Peak Flow      Pain Score 10     Pain Loc      Pain Edu?      Excl. in GC?     Constitutional: Alert and oriented. Answers questions  appropriately.  Mildly uncomfortable appearing. Eyes: Conjunctivae are normal.  EOMI. No scleral icterus. Head: Atraumatic. Nose: No congestion/rhinnorhea. Mouth/Throat: Mucous membranes are moist.  Neck: No stridor.  Supple.   Cardiovascular: Normal rate, regular rhythm. No murmurs, rubs or gallops.  Respiratory: Normal respiratory effort.  No accessory muscle use or retractions. Lungs CTAB.  No wheezes, rales or ronchi. Gastrointestinal: Soft, and nondistended.  Mild tenderness to palpation in the left lower quadrant.  Mild left CVA tenderness to palpation.  No guarding or rebound.  No peritoneal signs. Musculoskeletal: No LE edema.  Neurologic:  A&Ox3.  Speech is clear.  Face and smile are symmetric.  EOMI.  Moves all extremities well. Skin:  Skin is warm, dry and intact. No rash noted. Psychiatric: Mood and affect are normal.   ____________________________________________   LABS (all labs ordered are listed, but only abnormal results are displayed)  Labs Reviewed  BASIC METABOLIC PANEL - Abnormal; Notable for the following components:      Result Value   Glucose, Bld 120 (*)    All other components within normal limits  CBC   ____________________________________________  EKG  Not indicated ____________________________________________  RADIOLOGY  No results found.  ____________________________________________   PROCEDURES  Procedure(s) performed: None  Procedures  Critical Care performed: No ____________________________________________   INITIAL IMPRESSION / ASSESSMENT AND PLAN / ED COURSE  Pertinent labs & imaging results that were available during my care of the patient were reviewed by me and considered in my medical decision making (see chart for details).  43 y.o. male with a known 7 mm left UPJ stone scheduled for lithotripsy tomorrow presenting with uncontrolled pain.  Overall, the patient is hemodynamically stable and afebrile.  I do not see any  evidence that he has a new cause for his pain, including diverticulitis, or testicular pathology.  The patient's creatinine today is reassuring and his white blood cell count remains normal.  He was unable to give a urine specimen.  However, I have spoken with Dr. Delana Meyer office, who would like to see the patient immediately and he will lose his appointment if he is not discharged from the emergency department with immediate appointment over at the clinic.  I do not want him to lose opportunity to have lithotripsy on his stone, so we will be given pain medication here and transferred over there for continued evaluation and treatment, including evaluation of his urine.  I have told him that his outpatient pain management plan will be managed by the urologist.  At this time, the patient is safe for discharge home.  He and his family member understand follow-up instructions as well as return precautions.  ____________________________________________  FINAL CLINICAL IMPRESSION(S) / ED DIAGNOSES  Final diagnoses:  Testicular pain  Left lower quadrant pain         NEW MEDICATIONS STARTED DURING THIS VISIT:  New Prescriptions  No medications on file      Rockne MenghiniNorman, Anne-Caroline, MD 12/20/18 1024

## 2018-12-20 NOTE — Discharge Instructions (Addendum)
I have spoken with Dr. Delana Meyer office, and they are saving an appointment slot for you at this time.  Please go immediately there to be set up for lithotripsy tomorrow.  Return to the emergency department for pain, fever, or any other symptoms concerning to you.

## 2018-12-20 NOTE — ED Triage Notes (Signed)
Pt reports dx'd with 71mm stone recently and is scheduled to follow up with a urologist today at 10 but the pain is to severe and he cannot wait. Pt also reports unable to urinate fully.

## 2018-12-20 NOTE — Progress Notes (Signed)
12/20/2018 10:47 AM   Jonny Ruiz [name] Dec 25, 1975 [id number]  Referring provider: No referring provider defined for this encounter.  Chief Complaint  Patient presents with  . Nephrolithiasis    HPI:  Mr. Raspa a 43 year old male who developed left flank pain December 12, 2018.  He underwent CT scan of the abdomen and pelvis which showed small bilateral stones and a 7 mm left proximal stone (?visible on scout, HU 600,ssd 10 cm).  His UA showed greater than 50 red blood cells, culture less than 10,000 insignificant growth.  Creatinine was 1.06.  White count was 7.  He was discharged from the emergency department.  He went back to the emergency department today with more left lower quadrant pain, pain radiating into the left groin or testicle and urgency to void. His bladder scanned for only 130 ml. He also noted a small amount of blood when he voided. He hasn't seen a stone pass. He was given narcotics but no Toradol in the event we may do shockwave tomorrow.  His white count was 8.7, creatinine 1.14 and vitals normal. He was sent for a KUB -- KUB equivocal. Unfortunately, a lot of the left proximal ureter obscured by bowel. I didn't see a distal stone. There is a small left phlebolith. I spoke with radiology and they couldn't see a ureteral stone either, so I went ahead and gave him toradol. He looks a lot better anyway and was kind of dozing off when I spoke to him. He's here with his wife.   PMH: Past Medical History:  Diagnosis Date  . Anxiety   . Back pain   . CHF (congestive heart failure) (HCC)   . COPD (chronic obstructive pulmonary disease) (HCC)   . PTSD (post-traumatic stress disorder)     Surgical History: Past Surgical History:  Procedure Laterality Date  . BACK SURGERY      Home Medications:  Allergies as of 12/20/2018   No Known Allergies     Medication List       Accurate as of December 20, 2018 10:47 AM. Always use your most recent med list.          albuterol 108 (90 Base) MCG/ACT inhaler Commonly known as:  PROVENTIL HFA;VENTOLIN HFA Inhale 2 puffs into the lungs every 6 (six) hours as needed for wheezing or shortness of breath.   clonazePAM 0.5 MG tablet Commonly known as:  KLONOPIN Take 1 tablet (0.5 mg total) by mouth 3 (three) times daily.   LYRICA 300 MG capsule Generic drug:  pregabalin Take 300 mg by mouth 2 (two) times daily.   oxyCODONE-acetaminophen 5-325 MG tablet Commonly known as:  PERCOCET Take 1 tablet by mouth every 4 (four) hours as needed for severe pain.   tamsulosin 0.4 MG Caps capsule Commonly known as:  FLOMAX Take 1 capsule (0.4 mg total) by mouth daily.   tiotropium 18 MCG inhalation capsule Commonly known as:  SPIRIVA Place 1 capsule (18 mcg total) into inhaler and inhale every morning.       Allergies: No Known Allergies  Family History: Family History  Problem Relation Age of Onset  . Hypertension Father     Social History:  reports that he has quit smoking. He has never used smokeless tobacco. He reports current alcohol use. He reports current drug use. Drugs: Cocaine and Marijuana.  ROS:  Physical Exam: There were no vitals taken for this visit.  Constitutional:  Alert and oriented, No acute distress, but writhing in pain  HEENT: [place] AT, moist mucus membranes.  Trachea midline, no masses. Cardiovascular: No clubbing, cyanosis, or edema. Respiratory: Normal respiratory effort, no increased work of breathing. GI: Abdomen is soft, nontender, nondistended, no abdominal masses GU: No CVA tenderness; penis circumcised and without mass or lesion, scrotum normal, testicles descended and palpanly normal  Lymph: No cervical or inguinal lymphadenopathy. Skin: No rashes, bruises or suspicious lesions. Neurologic: Grossly intact, no focal deficits, moving all 4 extremities. Psychiatric: Normal mood and affect.  Laboratory  Data: Lab Results  Component Value Date   WBC 8.7 12/20/2018   HGB 14.6 12/20/2018   HCT 43.7 12/20/2018   MCV 87.2 12/20/2018   PLT 239 12/20/2018    Lab Results  Component Value Date   CREATININE 1.14 12/20/2018    No results found for: PSA  No results found for: TESTOSTERONE  No results found for: HGBA1C  Urinalysis    Component Value Date/Time   COLORURINE YELLOW (A) 12/12/2018 1830   APPEARANCEUR CLEAR (A) 12/12/2018 1830   APPEARANCEUR Clear 06/21/2014 2148   LABSPEC 1.019 12/12/2018 1830   LABSPEC 1.023 06/21/2014 2148   PHURINE 6.0 12/12/2018 1830   GLUCOSEU NEGATIVE 12/12/2018 1830   GLUCOSEU Negative 06/21/2014 2148   HGBUR LARGE (A) 12/12/2018 1830   BILIRUBINUR NEGATIVE 12/12/2018 1830   BILIRUBINUR Negative 06/21/2014 2148   KETONESUR NEGATIVE 12/12/2018 1830   PROTEINUR NEGATIVE 12/12/2018 1830   NITRITE NEGATIVE 12/12/2018 1830   LEUKOCYTESUR NEGATIVE 12/12/2018 1830   LEUKOCYTESUR Negative 06/21/2014 2148    Lab Results  Component Value Date   BACTERIA NONE SEEN 12/12/2018    Pertinent Imaging: Ct images  Results for orders placed during the hospital encounter of 03/24/16  DG Abd 1 View   Narrative CLINICAL DATA:  Hematuria. Abdominal pain for 2 months. History of kidney stones.  EXAM: ABDOMEN - 1 VIEW  COMPARISON:  CT 01/27/2013  FINDINGS: Large stool burden throughout the colon. Nonobstructive bowel gas pattern. No free air organomegaly. No suspicious calcification. Visualized lung bases are clear. No acute bony abnormality.  IMPRESSION: Large stool burden.  No acute findings.   Electronically Signed   By: Charlett Nose M.D.   On: 03/24/2016 18:56    No results found for this or any previous visit. No results found for this or any previous visit. No results found for this or any previous visit. No results found for this or any previous visit. No results found for this or any previous visit. No results found for this or any  previous visit. Results for orders placed during the hospital encounter of 12/12/18  CT Renal Stone Study   Narrative CLINICAL DATA:  43 year old male with history of bilateral flank and lower abdominal pain with hematuria for the past week.  EXAM: CT ABDOMEN AND PELVIS WITHOUT CONTRAST  TECHNIQUE: Multidetector CT imaging of the abdomen and pelvis was performed following the standard protocol without IV contrast.  COMPARISON:  CT the abdomen and pelvis 06/20/2018.  FINDINGS: Lower chest: Unremarkable.  Hepatobiliary: No definite cystic or solid hepatic lesions are confidently identified on today's noncontrast CT examination. Unenhanced appearance of the gallbladder is unremarkable.  Pancreas: No definite pancreatic mass or peripancreatic fluid or inflammatory changes are noted on today's noncontrast CT examination.  Spleen: Unremarkable.  Adrenals/Urinary Tract: Multiple nonobstructive calculi are noted within the collecting systems of both kidneys, largest of  which is in the mid 5 mm calculus in the interpolar collecting system of left kidney. In addition, there is a 7 mm calculus at the left ureteropelvic junction. This is not associated with significant proximal hydronephrosis. No other calculi are noted along the course of either ureter, or within the lumen of the urinary bladder. No hydroureter. Unenhanced appearance of the urinary bladder is normal. Bilateral adrenal glands are normal in appearance.  Stomach/Bowel: Unenhanced appearance of the stomach is normal. There is no pathologic dilatation of small bowel or colon. Normal appendix.  Vascular/Lymphatic: Atherosclerotic calcifications in the pelvic vasculature. No lymphadenopathy noted in the abdomen or pelvis.  Reproductive: Prostate gland and seminal vesicles are unremarkable in appearance.  Other: No significant volume of ascites.  No pneumoperitoneum.  Musculoskeletal: Chronic L1 compression fracture  with 60% loss of anterior vertebral body height, unchanged. There are no aggressive appearing lytic or blastic lesions noted in the visualized portions of the skeleton.  IMPRESSION: 1. 7 mm calculus at the left ureteropelvic junction. This is not associated with proximal hydronephrosis to indicate obstruction at this time. 2. Multiple additional nonobstructive calculi are noted within the interpolar collecting systems of both kidneys measuring up to 5 mm on the left side. 3. Additional incidental findings, as above.   Electronically Signed   By: Trudie Reedaniel  Entrikin M.D.   On: 12/12/2018 21:04     Assessment & Plan:    1. Kidney stones -small stones in the kidneys   2. Left ureteral stone - by history the stone has dropped into the left distal ureter and likely at the left UVJ. KUB equivocal. He felt much better after some giving the ED pain meds some time and after ketorolac. I discussed the nature r/b/a to cystoscopy with left RGP, left URS and he'll continue stone passage. It's likely close to passage, but not the best candidate for ESWL given it cant be localized on the KUB. He was also given a strainer.   No follow-ups on file.  Jerilee FieldMatthew Nayomi Tabron, MD  [facility] [address], [address] [place], [place] [address] [phone]

## 2018-12-29 ENCOUNTER — Ambulatory Visit (INDEPENDENT_AMBULATORY_CARE_PROVIDER_SITE_OTHER): Payer: Medicare Other | Admitting: Urology

## 2018-12-29 ENCOUNTER — Encounter: Payer: Self-pay | Admitting: Urology

## 2018-12-29 VITALS — BP 126/84 | HR 84 | Ht 71.0 in | Wt 150.0 lb

## 2018-12-29 DIAGNOSIS — N201 Calculus of ureter: Secondary | ICD-10-CM

## 2018-12-29 DIAGNOSIS — N23 Unspecified renal colic: Secondary | ICD-10-CM | POA: Diagnosis not present

## 2018-12-29 MED ORDER — TAMSULOSIN HCL 0.4 MG PO CAPS: 0.4000 mg | ORAL_CAPSULE | Freq: Every day | ORAL | 0 refills | Status: DC

## 2018-12-29 MED ORDER — OXYCODONE-ACETAMINOPHEN 5-325 MG PO TABS: 1.0000 | ORAL_TABLET | ORAL | 0 refills | Status: DC | PRN

## 2018-12-30 ENCOUNTER — Encounter: Payer: Self-pay | Admitting: Urology

## 2018-12-30 NOTE — Progress Notes (Signed)
12/29/2018 9:52 PM   Reginald Hopkins Reginald Hopkins [birth year] [id number]  Referring provider: No referring provider defined for this encounter.  Chief Complaint  Patient presents with  . Nephrolithiasis    1 week fu per [place]    HPI: 43 year old male presents for follow-up of renal colic.  He saw Dr. Mena Goes last week with a 7 mm left ureteral calculus that was felt to have migrated to the UVJ.  The stone was not visualized on KUB and he has continued with a trial of passage.  He thinks he passed a stone 2 days ago however still having pain primarily in the left lower quadrant associated with urinary hesitancy, frequency and voiding small amounts.  He denies fever or chills.  He states the stone he passed was "fairly good size".  Denies fever or chills.   PMH: Past Medical History:  Diagnosis Date  . Anxiety   . Back pain   . CHF (congestive heart failure) (HCC)   . COPD (chronic obstructive pulmonary disease) (HCC)   . PTSD (post-traumatic stress disorder)     Surgical History: Past Surgical History:  Procedure Laterality Date  . BACK SURGERY      Home Medications:  Allergies as of 12/29/2018   No Known Allergies     Medication List       Accurate as of December 29, 2018 11:59 PM. Always use your most recent med list.        albuterol 108 (90 Base) MCG/ACT inhaler Commonly known as:  PROVENTIL HFA;VENTOLIN HFA Inhale 2 puffs into the lungs every 6 (six) hours as needed for wheezing or shortness of breath.   clonazePAM 0.5 MG tablet Commonly known as:  KLONOPIN Take 1 tablet (0.5 mg total) by mouth 3 (three) times daily.   LORazepam 0.5 MG tablet Commonly known as:  ATIVAN   LYRICA 300 MG capsule Generic drug:  pregabalin Take 300 mg by mouth 2 (two) times daily.   oxyCODONE-acetaminophen 5-325 MG tablet Commonly known as:  PERCOCET Take 1 tablet by mouth every 4 (four) hours as needed for severe pain.   tamsulosin 0.4 MG Caps capsule Commonly known as:   FLOMAX Take 1 capsule (0.4 mg total) by mouth daily.   tiotropium 18 MCG inhalation capsule Commonly known as:  SPIRIVA Place 1 capsule (18 mcg total) into inhaler and inhale every morning.       Allergies: No Known Allergies  Family History: Family History  Problem Relation Age of Onset  . Hypertension Father     Social History:  reports that he has quit smoking. He has never used smokeless tobacco. He reports current alcohol use. He reports current drug use. Drugs: Cocaine and Marijuana.  ROS: UROLOGY Frequent Urination?: Yes Hard to postpone urination?: No Burning/pain with urination?: Yes Get up at night to urinate?: Yes Leakage of urine?: No Urine stream starts and stops?: No Trouble starting stream?: Yes Do you have to strain to urinate?: No Blood in urine?: No Urinary tract infection?: No Sexually transmitted disease?: No Injury to kidneys or bladder?: No Painful intercourse?: No Weak stream?: No Erection problems?: No Penile pain?: No  Gastrointestinal Nausea?: No Vomiting?: No Indigestion/heartburn?: Yes Diarrhea?: No Constipation?: No  Constitutional Fever: No Night sweats?: Yes Weight loss?: No Fatigue?: No  Skin Skin rash/lesions?: No Itching?: No  Eyes Blurred vision?: No Double vision?: No  Ears/Nose/Throat Sore throat?: No Sinus problems?: Yes  Hematologic/Lymphatic Swollen glands?: No Easy bruising?: No  Cardiovascular Leg swelling?: No Chest pain?:  No  Respiratory Cough?: No Shortness of breath?: Yes  Endocrine Excessive thirst?: No  Musculoskeletal Back pain?: Yes Joint pain?: Yes  Neurological Headaches?: Yes Dizziness?: No  Psychologic Depression?: Yes Anxiety?: Yes  Physical Exam: BP 126/84 (BP Location: Left Arm, Patient Position: Sitting)   Pulse 84   Ht 5\' 11"  (1.803 m)   Wt 150 lb (68 kg)   BMI 20.92 kg/m   Constitutional:  Alert and oriented, No acute distress. HEENT: [place] AT, moist mucus  membranes.  Trachea midline, no masses. Cardiovascular: No clubbing, cyanosis, or edema. Respiratory: Normal respiratory effort, no increased work of breathing. GI: Abdomen is soft, nontender, nondistended, no abdominal masses GU: No CVA tenderness Lymph: No cervical or inguinal lymphadenopathy. Skin: No rashes, bruises or suspicious lesions. Neurologic: Grossly intact, no focal deficits, moving all 4 extremities. Psychiatric: Normal mood and affect.     Assessment & Plan:   He continues to have persistent pain.  We will repeat his KUB and if the stone not definitely visualized would recommend repeat CT with his history of passing a stone.  Tamsulosin and oxycodone were refilled.  Riki Altes, MD  [facility] [address] [place], [place] [address] [phone]

## 2019-01-05 ENCOUNTER — Telehealth: Payer: Self-pay | Admitting: Urology

## 2019-01-05 NOTE — Telephone Encounter (Signed)
Pt has a lot going on and he wanted you to know that he will go for KUB on Monday morning.  Just F.Y.I.

## 2019-01-25 ENCOUNTER — Emergency Department
Admission: EM | Admit: 2019-01-25 | Discharge: 2019-01-25 | Disposition: A | Discharge status: Home / Self Care | Payer: Medicare Other | Attending: Emergency Medicine | Admitting: Emergency Medicine

## 2019-01-25 ENCOUNTER — Encounter: Payer: Self-pay | Admitting: Emergency Medicine

## 2019-01-25 ENCOUNTER — Emergency Department: Payer: Medicare Other

## 2019-01-25 ENCOUNTER — Other Ambulatory Visit: Payer: Self-pay

## 2019-01-25 DIAGNOSIS — Z79899 Other long term (current) drug therapy: Secondary | ICD-10-CM | POA: Insufficient documentation

## 2019-01-25 DIAGNOSIS — J449 Chronic obstructive pulmonary disease, unspecified: Secondary | ICD-10-CM | POA: Diagnosis not present

## 2019-01-25 DIAGNOSIS — F1410 Cocaine abuse, uncomplicated: Secondary | ICD-10-CM | POA: Insufficient documentation

## 2019-01-25 DIAGNOSIS — R319 Hematuria, unspecified: Secondary | ICD-10-CM | POA: Insufficient documentation

## 2019-01-25 DIAGNOSIS — Z87891 Personal history of nicotine dependence: Secondary | ICD-10-CM | POA: Insufficient documentation

## 2019-01-25 DIAGNOSIS — F1210 Cannabis abuse, uncomplicated: Secondary | ICD-10-CM | POA: Diagnosis not present

## 2019-01-25 DIAGNOSIS — R109 Unspecified abdominal pain: Secondary | ICD-10-CM

## 2019-01-25 LAB — COMPREHENSIVE METABOLIC PANEL
ALT: 33 U/L (ref 0–44)
AST: 25 U/L (ref 15–41)
Albumin: 4.3 g/dL (ref 3.5–5.0)
Alkaline Phosphatase: 61 U/L (ref 38–126)
Anion gap: 5 (ref 5–15)
BUN: 21 mg/dL — ABNORMAL HIGH (ref 6–20)
CO2: 29 mmol/L (ref 22–32)
Calcium: 9.2 mg/dL (ref 8.9–10.3)
Chloride: 105 mmol/L (ref 98–111)
Creatinine, Ser: 0.97 mg/dL (ref 0.61–1.24)
GFR calc Af Amer: >60 mL/min (ref >60)
GFR calc non Af Amer: >60 mL/min (ref >60)
Glucose, Bld: 91 mg/dL (ref 70–99)
Potassium: 4.1 mmol/L (ref 3.5–5.1)
Sodium: 139 mmol/L (ref 135–145)
TOTAL PROTEIN: 7 g/dL (ref 6.5–8.1)
Total Bilirubin: 0.7 mg/dL (ref 0.3–1.2)

## 2019-01-25 LAB — URINALYSIS, COMPLETE (UACMP) WITH MICROSCOPIC
Bacteria, UA: NONE SEEN
Bilirubin Urine: NEGATIVE
Glucose, UA: NEGATIVE mg/dL
Ketones, ur: NEGATIVE mg/dL
Leukocytes,Ua: NEGATIVE
Nitrite: NEGATIVE
Protein, ur: NEGATIVE mg/dL
RBC / HPF: >50 RBC/hpf — ABNORMAL HIGH (ref 0–5)
Specific Gravity, Urine: 1.019 (ref 1.005–1.030)
Squamous Epithelial / HPF: NONE SEEN (ref 0–5)
pH: 6 (ref 5.0–8.0)

## 2019-01-25 LAB — CBC
HCT: 44.9 % (ref 39.0–52.0)
Hemoglobin: 15 g/dL (ref 13.0–17.0)
MCH: 28.7 pg (ref 26.0–34.0)
MCHC: 33.4 g/dL (ref 30.0–36.0)
MCV: 86 fL (ref 80.0–100.0)
Platelets: 353 10*3/uL (ref 150–400)
RBC: 5.22 MIL/uL (ref 4.22–5.81)
RDW: 12.8 % (ref 11.5–15.5)
WBC: 5.3 10*3/uL (ref 4.0–10.5)
nRBC: 0 % (ref 0.0–0.2)

## 2019-01-25 MED ORDER — SODIUM CHLORIDE 0.9 % IV BOLUS
1000.0000 mL | Freq: Once | INTRAVENOUS | Status: AC
  Administered 2019-01-25: 1000 mL via INTRAVENOUS

## 2019-01-25 MED ORDER — OXYCODONE HCL 5 MG PO TABS
5.0000 mg | ORAL_TABLET | Freq: Once | ORAL | Status: AC
  Administered 2019-01-25: 5 mg via ORAL
  Filled 2019-01-25: qty 1

## 2019-01-25 MED ORDER — ONDANSETRON HCL 4 MG/2ML IJ SOLN
4.0000 mg | Freq: Once | INTRAMUSCULAR | Status: AC
  Administered 2019-01-25: 4 mg via INTRAVENOUS
  Filled 2019-01-25: qty 2

## 2019-01-25 NOTE — Discharge Instructions (Addendum)
Please seek medical attention for any high fevers, chest pain, shortness of breath, change in behavior, persistent vomiting, bloody stool or any other new or concerning symptoms.  

## 2019-01-25 NOTE — ED Triage Notes (Signed)
Says right flank pain. Say he had kidney stones and went to urologist but they could not find the stones.  Says he continues to have urgency, blood in urine, testicle pain.

## 2019-01-25 NOTE — ED Notes (Signed)
Patient upset he was given no pain medication to take home/prescription. Patient states "Im not coming back to this hospital. Francesca Oman aint fucking shit" Patient walked out after making that statement

## 2019-01-25 NOTE — ED Notes (Signed)
Patient not willing to let this RN get d/c vitals or sign for d/c

## 2019-01-25 NOTE — ED Provider Notes (Signed)
[name] Emergency Department Provider Note  ____________________________________________   I have reviewed the triage vital signs and the nursing notes.   HISTORY  Chief Complaint Flank Pain   History limited by: Not Limited   HPI Reginald LimesJohn Thomas Hopkins is a 43 y.o. male who presents to the emergency department today because of concerns for right flank pain.  The patient states he has been dealing with this pain on and off for about a month.  Had been diagnosed with a 7 mm kidney stone.  Did follow-up with urology however they were unable to see a kidney stone on x-rays.  There had been some discussion one time about a lithotripsy but this did not occur.  The patient states that his urine had cleared up for a couple days however the past 3 days he has noticed blood in them again.  He denies any fevers.  He states that he has had kidney stones on and off for the past couple of years.   Per medical record review patient has a history of recent ER visit for kidney stone.  Past Medical History:  Diagnosis Date  . Anxiety   . Back pain   . CHF (congestive heart failure) (HCC)   . COPD (chronic obstructive pulmonary disease) (HCC)   . PTSD (post-traumatic stress disorder)     Patient Active Problem List   Diagnosis Date Noted  . 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 Opiate abuse, episodic (HCC) 09/14/2018  . 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 Acute respiratory failure with hypoxia (HCC) 09/12/2018  . Acute respiratory failure (HCC) 09/12/2018  . COPD exacerbation (HCC) 02/10/2018  . 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 Explosive personality disorder (HCC)   . Bipolar 2 disorder, major depressive episode (HCC)   . GAD (generalized anxiety disorder)   . Substance induced mood disorder (HCC) 03/24/2016  . Cocaine abuse (HCC) 03/24/2016  . PTSD (post-traumatic stress disorder) 03/24/2016  . COPD  (chronic obstructive pulmonary disease) (HCC) 03/24/2016  . Antisocial personality disorder (HCC) 03/24/2016    Past Surgical History:  Procedure Laterality Date  . BACK SURGERY      Prior to Admission medications   Medication Sig Start Date End Date Taking? Authorizing Provider  albuterol (PROVENTIL HFA;VENTOLIN HFA) 108 (90 Base) MCG/ACT inhaler Inhale 2 puffs into the lungs every 6 (six) hours as needed for wheezing or shortness of breath. 11/15/18   Arnaldo NatalMalinda, Paul F, MD  clonazePAM (KLONOPIN) 0.5 MG tablet Take 1 tablet (0.5 mg total) by mouth 3 (three) times daily. 09/15/18   Altamese DillingVachhani, Vaibhavkumar, MD  LORazepam (ATIVAN) 0.5 MG tablet  11/16/18   [provider]  LYRICA 300 MG capsule Take 300 mg by mouth 2 (two) times daily. 05/26/18   [provider]  oxyCODONE-acetaminophen (PERCOCET) 5-325 MG tablet Take 1 tablet by mouth every 4 (four) hours as needed for severe pain. 12/29/18   Stoioff, Verna CzechScott C, MD  tamsulosin (FLOMAX) 0.4 MG CAPS capsule Take 1 capsule (0.4 mg total) by mouth daily. 12/29/18   Stoioff, Verna CzechScott C, MD  tiotropium (SPIRIVA) 18 MCG inhalation capsule Place 1 capsule (18 mcg total) into inhaler and inhale every morning. 09/16/18   Altamese DillingVachhani, Vaibhavkumar, MD    Allergies Patient has no known allergies.  Family History  Problem Relation Age of Onset  . Hypertension Father     Social History Social History   Tobacco Use  . Smoking status: Former Games developermoker  .  Smokeless tobacco: Never Used  Substance Use Topics  . Alcohol use: Yes  . Drug use: Yes    Types: Cocaine, Marijuana    Review of Systems Constitutional: No fever/chills Eyes: No visual changes. ENT: No sore throat. Cardiovascular: Denies chest pain. Respiratory: Denies shortness of breath. Gastrointestinal: Positive for right flank pain. Genitourinary: Positive for bloody urine and increased frequency of urination. Musculoskeletal: Negative for back pain. Skin: Negative for  rash. Neurological: Negative for headaches, focal weakness or numbness.  ____________________________________________   PHYSICAL EXAM:  VITAL SIGNS: ED Triage Vitals  Enc Vitals Group     BP 01/25/19 1449 121/73     Pulse Rate 01/25/19 1449 (!) 109     Resp 01/25/19 1449 16     Temp 01/25/19 1449 97.7 F (36.5 C)     Temp Source 01/25/19 1449 Oral     SpO2 01/25/19 1449 100 %     Weight 01/25/19 1450 150 lb (68 kg)     Height 01/25/19 1450 5\' 11"  (1.803 m)     Head Circumference --      Peak Flow --      Pain Score 01/25/19 1450 8   Constitutional: Alert and oriented.  Eyes: Conjunctivae are normal.  ENT      Head: Normocephalic and atraumatic.      Nose: No congestion/rhinnorhea.      Mouth/Throat: Mucous membranes are moist.      Neck: No stridor. Hematological/Lymphatic/Immunilogical: No cervical lymphadenopathy. Cardiovascular: Normal rate, regular rhythm.  No murmurs, rubs, or gallops.  Respiratory: Normal respiratory effort without tachypnea nor retractions. Breath sounds are clear and equal bilaterally. No wheezes/rales/rhonchi. Gastrointestinal: Soft and non tender. No rebound. No guarding.  Genitourinary: Deferred Musculoskeletal: Normal range of motion in all extremities. No lower extremity edema. Neurologic:  Normal speech and language. No gross focal neurologic deficits are appreciated.  Skin:  Skin is warm, dry and intact. No rash noted. Psychiatric: Mood and affect are normal. Speech and behavior are normal. Patient exhibits appropriate insight and judgment.  ____________________________________________    LABS (pertinent positives/negatives)  CMP wnl BUN 21 UA hazy, large hgb dipstick, >50 RBC CBC wbc 5.3, hgb 15.0, plt 353 ____________________________________________   EKG  None  ____________________________________________    RADIOLOGY  CT renal Non obstructing 2 mm midpole left kidney  stone  ____________________________________________   PROCEDURES  Procedures  ____________________________________________   INITIAL IMPRESSION / ASSESSMENT AND PLAN / ED COURSE  Pertinent labs & imaging results that were available during my care of the patient were reviewed by me and considered in my medical decision making (see chart for details).   Patient presented to the emergency department today because of concerns for right flank pain in the setting of somewhat recent diagnosis of a right kidney stone.  CT scan today does not show any right ureteral kidney stone.  He does appear to have passing previous kidney stone.  Did show a 2 mm left kidney stone midpole.  I discussed this with patient.  At this point I tried to explain to the patient that this kidney stone would not be causing the discomfort or the pain.  Patient did ask vascular prescribed pain medication however at this point I am hesitant to prescribe further narcotics especially given finding of large stool burden on CT and no obvious etiology of the patient's pain.  Will have patient follow-up with urology clinic. ____________________________________________   FINAL CLINICAL IMPRESSION(S) / ED DIAGNOSES  Final diagnoses:  Right flank pain  Note: This dictation was prepared with Dragon dictation. Any transcriptional errors that result from this process are unintentional     Phineas Semen, MD 01/25/19 [phone]

## 2019-02-02 ENCOUNTER — Ambulatory Visit: Payer: Self-pay | Admitting: Urology

## 2019-02-05 ENCOUNTER — Ambulatory Visit: Payer: Self-pay | Admitting: Urology

## 2019-02-19 ENCOUNTER — Ambulatory Visit: Payer: Self-pay | Admitting: Urology

## 2019-02-21 ENCOUNTER — Ambulatory Visit: Payer: Self-pay | Admitting: Urology

## 2019-03-01 ENCOUNTER — Encounter: Payer: Self-pay | Admitting: Urology

## 2019-03-01 ENCOUNTER — Other Ambulatory Visit: Payer: Self-pay

## 2019-03-01 ENCOUNTER — Ambulatory Visit (INDEPENDENT_AMBULATORY_CARE_PROVIDER_SITE_OTHER): Payer: Medicare Other | Admitting: Urology

## 2019-03-01 VITALS — BP 101/65 | HR 76 | Ht 71.0 in | Wt 142.5 lb

## 2019-03-01 DIAGNOSIS — M545 Low back pain, unspecified: Secondary | ICD-10-CM | POA: Insufficient documentation

## 2019-03-01 DIAGNOSIS — R109 Unspecified abdominal pain: Secondary | ICD-10-CM

## 2019-03-01 DIAGNOSIS — N5082 Scrotal pain: Secondary | ICD-10-CM

## 2019-03-01 DIAGNOSIS — R519 Headache, unspecified: Secondary | ICD-10-CM | POA: Insufficient documentation

## 2019-03-01 DIAGNOSIS — S62329A Displaced fracture of shaft of unspecified metacarpal bone, initial encounter for closed fracture: Secondary | ICD-10-CM | POA: Insufficient documentation

## 2019-03-01 DIAGNOSIS — M79643 Pain in unspecified hand: Secondary | ICD-10-CM | POA: Insufficient documentation

## 2019-03-01 DIAGNOSIS — R51 Headache: Secondary | ICD-10-CM

## 2019-03-01 DIAGNOSIS — M4850XA Collapsed vertebra, not elsewhere classified, site unspecified, initial encounter for fracture: Secondary | ICD-10-CM | POA: Insufficient documentation

## 2019-03-01 DIAGNOSIS — S93409A Sprain of unspecified ligament of unspecified ankle, initial encounter: Secondary | ICD-10-CM | POA: Insufficient documentation

## 2019-03-01 MED ORDER — NAPROXEN 500 MG PO TABS: 500.0000 mg | ORAL_TABLET | Freq: Two times a day (BID) | ORAL | 0 refills | Status: DC | PRN

## 2019-03-01 NOTE — Progress Notes (Signed)
03/01/2019 9:42 AM   Jonny Ruiz [name] 1976/02/18 [id number]  Referring provider: No referring provider defined for this encounter.  Chief Complaint  Patient presents with  . Nephrolithiasis    HPI: 43 year old male presents for follow-up of a left ureteral calculus.  I last saw him on 12/29/2018.  He continued to have intermittent left flank pain.  He was seen in the ED on 01/25/2019 and had a repeat CT.  The radiology interpretation was a 2 mm nonobstructing left renal calculus and no ureteral calculi.  On my review he had 2 distal left ureteral stones with mild hydroureter.  He passed both of these stones approximately 1 week ago and brings them in today.  His left flank pain has resolved.  He does complain of chronic right flank pain and bilateral scrotal pain L >R.  He has a history of L1 compression fracture.  He has no voiding complaints.   PMH: Past Medical History:  Diagnosis Date  . Anxiety   . Back pain   . CHF (congestive heart failure) (HCC)   . COPD (chronic obstructive pulmonary disease) (HCC)   . PTSD (post-traumatic stress disorder)     Surgical History: Past Surgical History:  Procedure Laterality Date  . BACK SURGERY      Home Medications:  Allergies as of 03/01/2019   No Known Allergies     Medication List       Accurate as of March 01, 2019  9:42 AM. Always use your most recent med list.        clonazePAM 0.5 MG tablet Commonly known as:  KLONOPIN Take 1 tablet (0.5 mg total) by mouth 3 (three) times daily.       Allergies: No Known Allergies  Family History: Family History  Problem Relation Age of Onset  . Hypertension Father     Social History:  reports that he has quit smoking. He has never used smokeless tobacco. He reports current alcohol use. He reports current drug use. Drugs: Cocaine and Marijuana.  ROS: UROLOGY Frequent Urination?: Yes Hard to postpone urination?: No Burning/pain with urination?: No Get up at night to  urinate?: Yes Leakage of urine?: No Urine stream starts and stops?: No Trouble starting stream?: No Do you have to strain to urinate?: Yes Blood in urine?: No Urinary tract infection?: No Sexually transmitted disease?: No Injury to kidneys or bladder?: No Painful intercourse?: No Weak stream?: No Erection problems?: No Penile pain?: No  Gastrointestinal Nausea?: No Vomiting?: No Indigestion/heartburn?: No Diarrhea?: No Constipation?: No  Constitutional Fever: No Night sweats?: No Weight loss?: No Fatigue?: No  Skin Skin rash/lesions?: No Itching?: No  Eyes Blurred vision?: No Double vision?: No  Ears/Nose/Throat Sore throat?: No Sinus problems?: Yes  Hematologic/Lymphatic Swollen glands?: No Easy bruising?: No  Cardiovascular Leg swelling?: No Chest pain?: No  Respiratory Cough?: Yes Shortness of breath?: Yes  Endocrine Excessive thirst?: Yes  Musculoskeletal Back pain?: Yes Joint pain?: No  Neurological Headaches?: Yes Dizziness?: No  Psychologic Depression?: No Anxiety?: Yes  Physical Exam: BP 101/65 (BP Location: Left Arm, Patient Position: Sitting, Cuff Size: Normal)   Pulse 76   Ht 5\' 11"  (1.803 m)   Wt 142 lb 8 oz (64.6 kg)   BMI 19.87 kg/m   Constitutional:  Alert and oriented, No acute distress. HEENT: [place] AT, moist mucus membranes.  Trachea midline, no masses. Cardiovascular: No clubbing, cyanosis, or edema. Respiratory: Normal respiratory effort, no increased work of breathing. GI: Abdomen is soft, nontender, nondistended,  no abdominal masses GU: No CVA tenderness.  Penis without lesions, testes descended bilaterally without masses or tenderness.  Small left epididymal cyst, nontender otherwise cord/epididymis palpably normal bilaterally. Lymph: No cervical or inguinal lymphadenopathy. Skin: No rashes, bruises or suspicious lesions. Neurologic: Grossly intact, no focal deficits, moving all 4 extremities. Psychiatric: Normal  mood and affect.   Assessment & Plan:   43 year old male with 2 ureteral calculi recently passed.  Will send for analysis.  He will be notified with results and would also recommend a metabolic evaluation.  CT shows no right urinary calculi and his right flank pain most likely musculoskeletal in etiology.  He no longer has a PCP and I recommended he try several of the [facility] practices to get an appointment.  His scrotal pain may be neuropathic.  Will initially give an NSAID trial.  Rx naproxen sent to pharmacy.  Riki Altes, MD  [facility] [address] [place], [place] [address] [phone]

## 2019-03-08 ENCOUNTER — Telehealth: Payer: Self-pay | Admitting: Urology

## 2019-03-08 ENCOUNTER — Other Ambulatory Visit: Payer: Self-pay | Admitting: Urology

## 2019-03-08 NOTE — Telephone Encounter (Signed)
Patient notified and litholink sent. Patient states "my nuts hurt real bad and that medication don't work" . He would like a different pain medication sent in to help with this pain

## 2019-03-08 NOTE — Telephone Encounter (Signed)
Left pt mess to call 

## 2019-03-08 NOTE — Telephone Encounter (Signed)
Stone analysis was mixed calcium oxalate/calcium phosphate.  Recommend metabolic evaluation both blood work and 24-hour urine

## 2019-03-09 MED ORDER — AMITRIPTYLINE HCL 25 MG PO TABS: 25.0000 mg | ORAL_TABLET | Freq: Every day | ORAL | 0 refills | Status: DC

## 2019-03-09 NOTE — Telephone Encounter (Signed)
Rx amitriptyline sent

## 2019-03-12 MED ORDER — CYCLOBENZAPRINE HCL 5 MG PO TABS: 5.0000 mg | ORAL_TABLET | Freq: Three times a day (TID) | ORAL | 0 refills | Status: DC | PRN

## 2019-03-12 NOTE — Telephone Encounter (Signed)
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  The amitriptyline is used for neuropathic pain which is why this was called in.  I do not treat this pain with narcotic pain medications.  He will need to see a primary care doctor or back specialist to have this further evaluated.  I will send in a limited supply of a muscle relaxant.

## 2019-03-12 NOTE — Telephone Encounter (Signed)
Patient called stating his "nuts still hurt real bad and now making his back hurt too" and doesn't understand why "a sleeping pill" (amitriptyline) was sent in. He states he sees a psychiatrist for those meds and wants "something for the pain, "maybe tylenol IIIs or a muscle relaxer." Advised patient that Dr Lonna Cobb wasn't in the office today but a message would be sent to him with his concerns. Patient verbalized understanding.

## 2019-03-13 ENCOUNTER — Other Ambulatory Visit: Payer: Self-pay | Admitting: Urology

## 2019-03-13 DIAGNOSIS — N5082 Scrotal pain: Secondary | ICD-10-CM

## 2019-03-13 MED ORDER — AMITRIPTYLINE HCL 25 MG PO TABS: ORAL_TABLET | ORAL | 1 refills | Status: AC

## 2019-03-13 NOTE — Telephone Encounter (Signed)
Called pt, number states that it cannot be reached at this time. Unable to leave message 1st attempt.

## 2019-03-13 NOTE — Addendum Note (Signed)
Addended by: Frankey Shown on: 03/13/2019 11:29 AM   Modules accepted: Orders

## 2019-03-14 NOTE — Telephone Encounter (Signed)
Unable to leave message on home #

## 2019-03-19 NOTE — Telephone Encounter (Signed)
3rd attempt - Letter mailed.

## 2019-07-18 ENCOUNTER — Ambulatory Visit: Payer: Medicare Other | Admitting: Family Medicine

## 2019-08-19 ENCOUNTER — Encounter: Payer: Self-pay | Admitting: Emergency Medicine

## 2019-08-19 ENCOUNTER — Emergency Department
Admission: EM | Admit: 2019-08-19 | Discharge: 2019-08-19 | Discharge status: Against Medical Advice | Payer: Medicare Other | Attending: Emergency Medicine | Admitting: Emergency Medicine

## 2019-08-19 ENCOUNTER — Encounter: Payer: Self-pay | Admitting: Medical Oncology

## 2019-08-19 ENCOUNTER — Other Ambulatory Visit: Payer: Self-pay

## 2019-08-19 ENCOUNTER — Emergency Department
Admission: EM | Admit: 2019-08-19 | Discharge: 2019-08-20 | Disposition: A | Discharge status: Home / Self Care | Payer: Medicare Other | Source: Home / Self Care | Attending: Emergency Medicine | Admitting: Emergency Medicine

## 2019-08-19 DIAGNOSIS — Z5321 Procedure and treatment not carried out due to patient leaving prior to being seen by health care provider: Secondary | ICD-10-CM | POA: Insufficient documentation

## 2019-08-19 DIAGNOSIS — T50901A Poisoning by unspecified drugs, medicaments and biological substances, accidental (unintentional), initial encounter: Secondary | ICD-10-CM

## 2019-08-19 DIAGNOSIS — I509 Heart failure, unspecified: Secondary | ICD-10-CM | POA: Insufficient documentation

## 2019-08-19 DIAGNOSIS — R4182 Altered mental status, unspecified: Secondary | ICD-10-CM | POA: Insufficient documentation

## 2019-08-19 DIAGNOSIS — F17290 Nicotine dependence, other tobacco product, uncomplicated: Secondary | ICD-10-CM | POA: Insufficient documentation

## 2019-08-19 DIAGNOSIS — J449 Chronic obstructive pulmonary disease, unspecified: Secondary | ICD-10-CM | POA: Insufficient documentation

## 2019-08-19 DIAGNOSIS — Z79899 Other long term (current) drug therapy: Secondary | ICD-10-CM | POA: Insufficient documentation
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
## 2019-08-19 NOTE — ED Notes (Signed)
Pt asking for pain medication and anxiety medication. Pt also asked this RN "how much longer am I going to be here". MD Isaacs aware.

## 2019-08-19 NOTE — ED Notes (Signed)
Pt ambulatory to hall bed and eating at this time in NAD.
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
## 2019-08-19 NOTE — ED Notes (Signed)
Pt walking around waiting room with steady gait, talking on his cell phone;

## 2019-08-20 DIAGNOSIS — R4182 Altered mental status, unspecified: Secondary | ICD-10-CM | POA: Diagnosis not present

## 2019-08-20 MED ORDER — NALOXONE HCL 0.4 MG/ML IJ SOLN: INTRAMUSCULAR | 1 refills | Status: AC

## 2019-08-20 MED ORDER — ALBUTEROL SULFATE HFA 108 (90 BASE) MCG/ACT IN AERS: 2.0000 | INHALATION_SPRAY | Freq: Four times a day (QID) | RESPIRATORY_TRACT | 0 refills | Status: AC | PRN

## 2019-08-20 MED ORDER — IBUPROFEN 600 MG PO TABS
600.0000 mg | ORAL_TABLET | Freq: Once | ORAL | Status: AC
  Administered 2019-08-20: 600 mg via ORAL
  Filled 2019-08-20: qty 1

## 2019-08-20 NOTE — ED Notes (Signed)
No signature pad available for pt's DC signature.

## 2019-11-07 ENCOUNTER — Other Ambulatory Visit: Payer: Self-pay

## 2019-11-07 DIAGNOSIS — Z20822 Contact with and (suspected) exposure to covid-19: Secondary | ICD-10-CM

## 2019-11-09 LAB — NOVEL CORONAVIRUS, NAA: SARS-CoV-2, NAA: NOT DETECTED

## 2019-12-01 IMAGING — DX DG CHEST 1V PORT
2 series · 2 of 2 positions shown · non-contrast
Comparison: 05/21/2018

CLINICAL DATA: Awoke with respiratory distress.

EXAM:
PORTABLE CHEST 1 VIEW

[chest ap (1 of 2)]
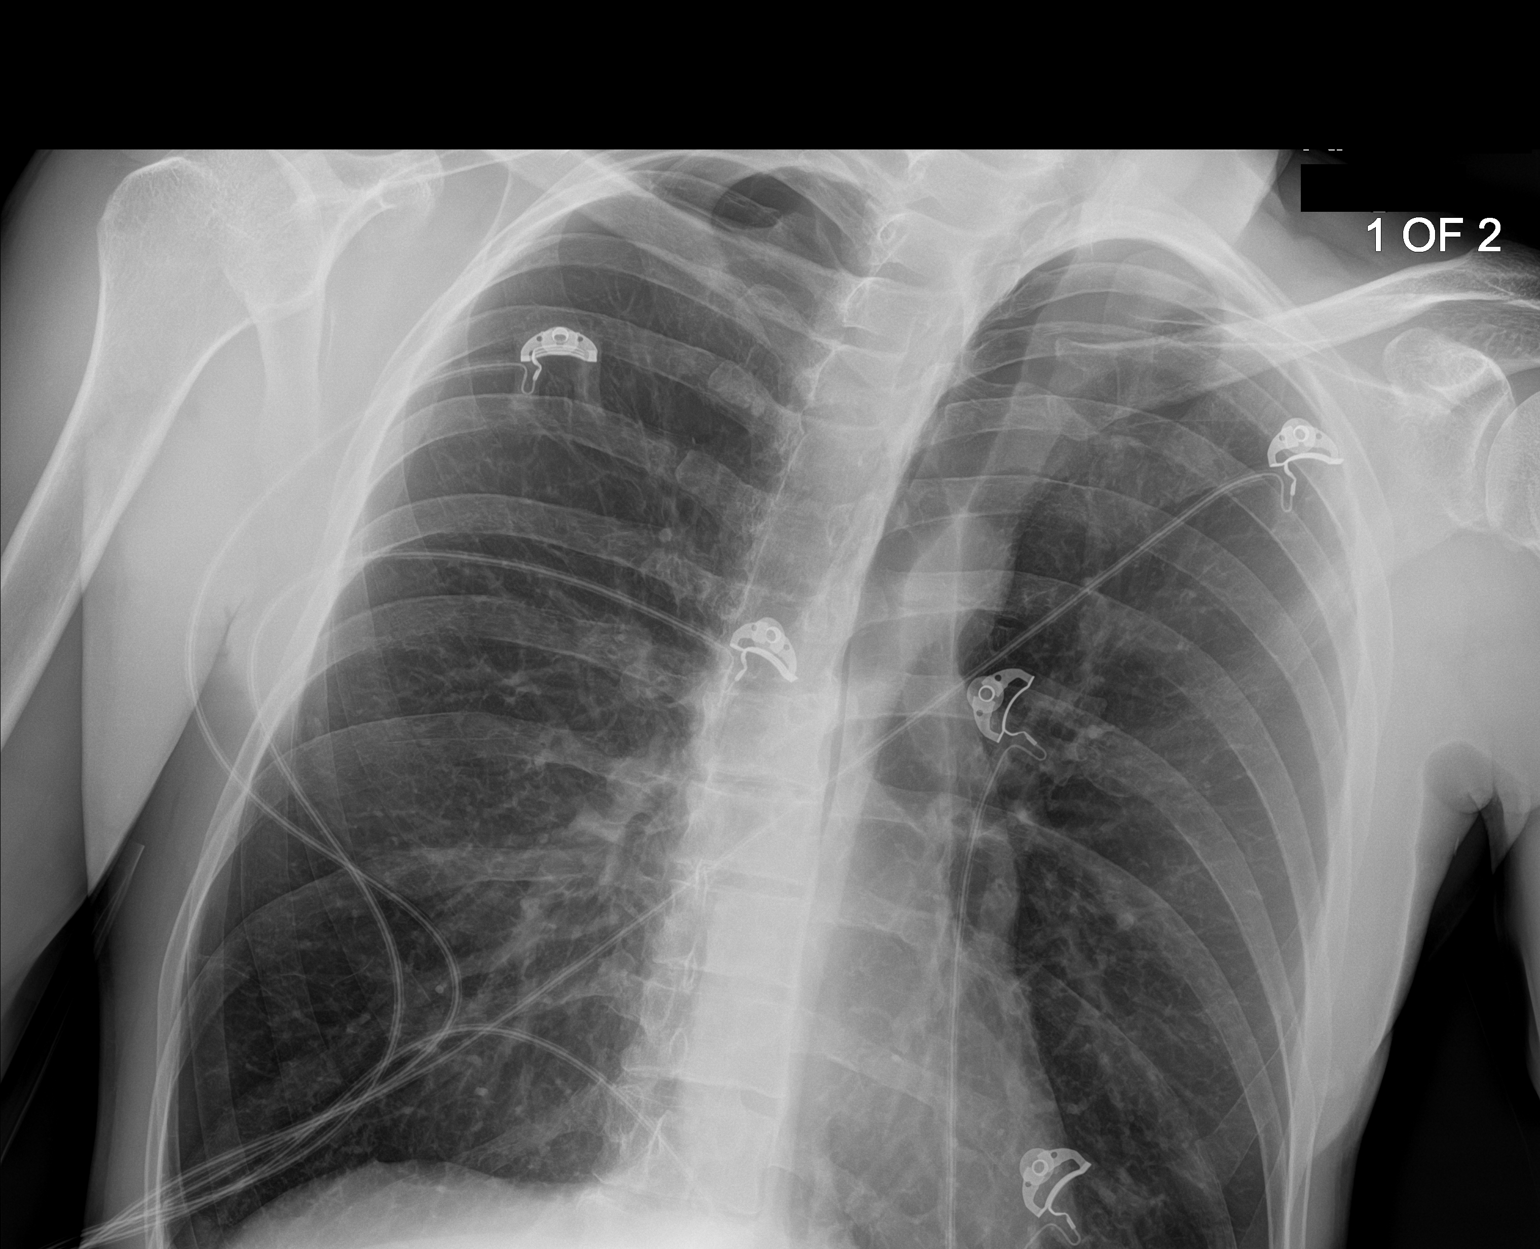

[chest ap (2 of 2)]
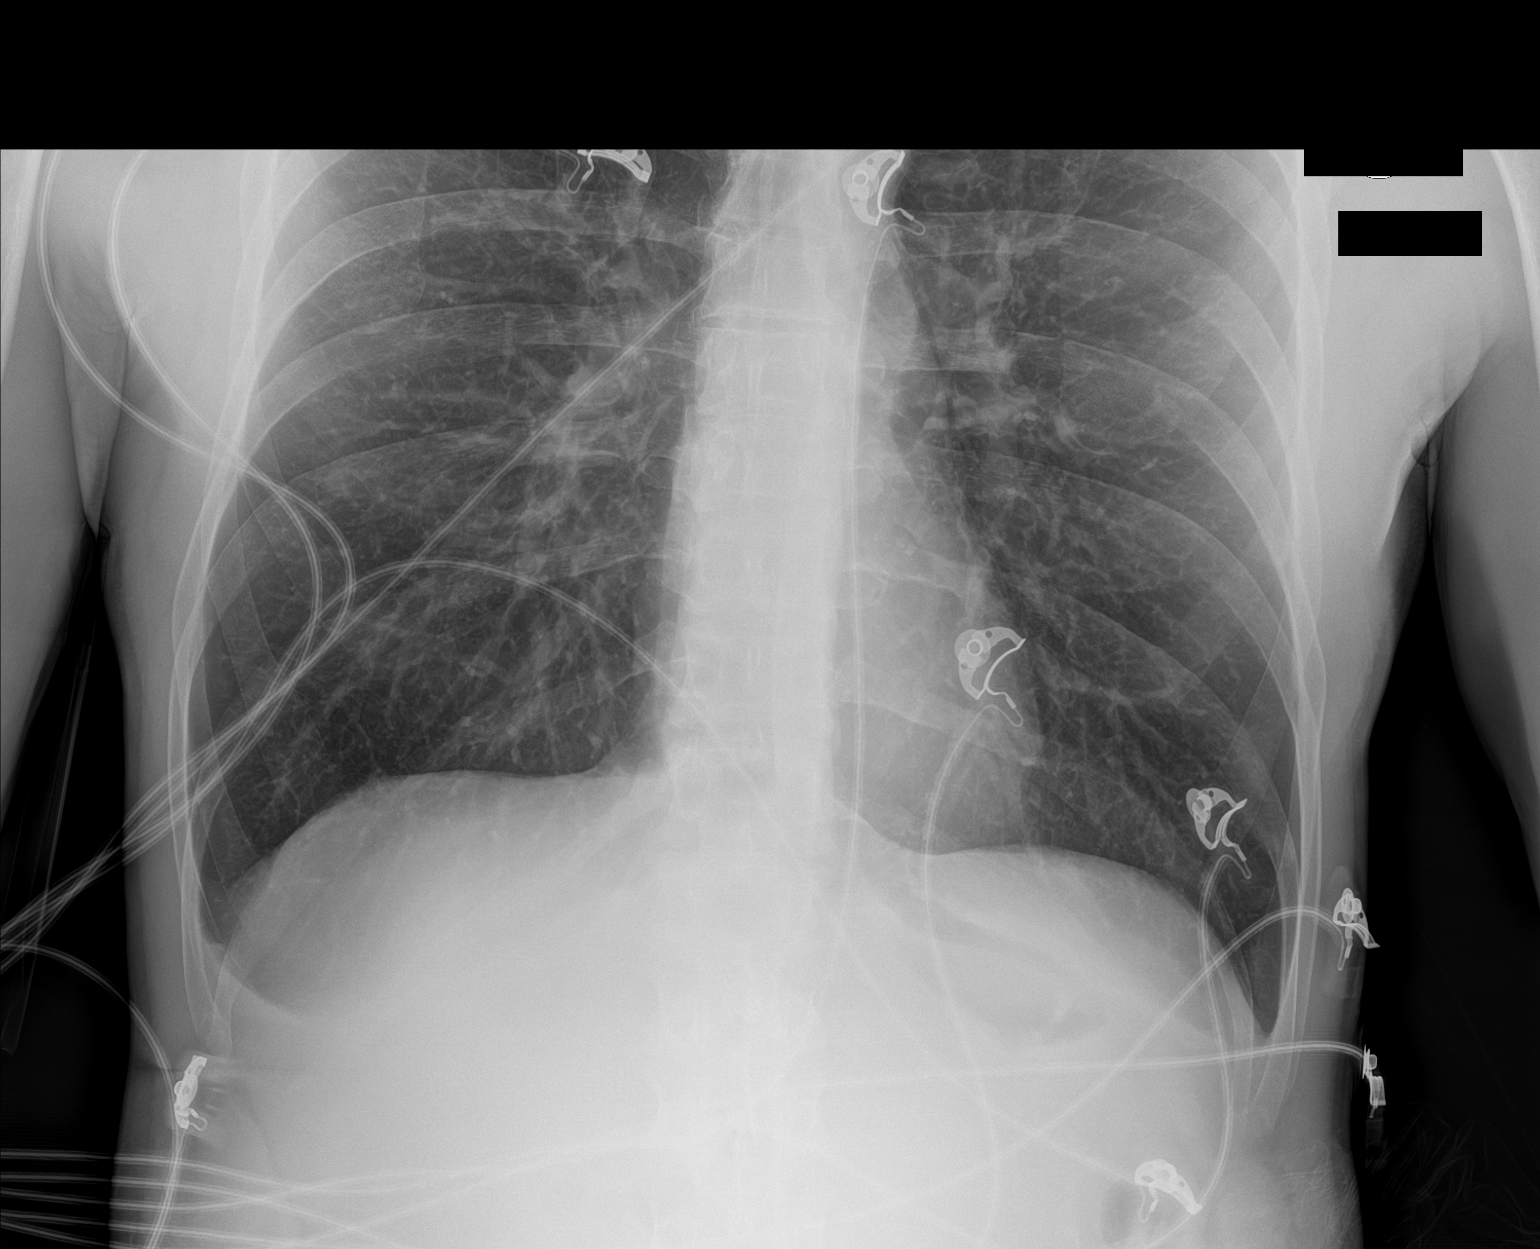

[2 of 2 positions shown; findings below may reference images not displayed]

FINDINGS: Chronic hyperinflation. No focal airspace disease. Normal heart size
and mediastinal contours. No pulmonary edema, pneumothorax, or
pleural effusion. No acute osseous abnormalities are seen.
IMPRESSION: Chronic hyperinflation without acute finding.

## 2019-12-02 IMAGING — CR DG CHEST 2V
1 series · 2 of 2 positions shown · non-contrast
Comparison: Radiographs yesterday

CLINICAL DATA: COPD exacerbation.

EXAM:
CHEST - 2 VIEW

[Series 1: dg chest 2 view · 0.14mm/px · 2 of 2 slices shown]
[im 1/2]
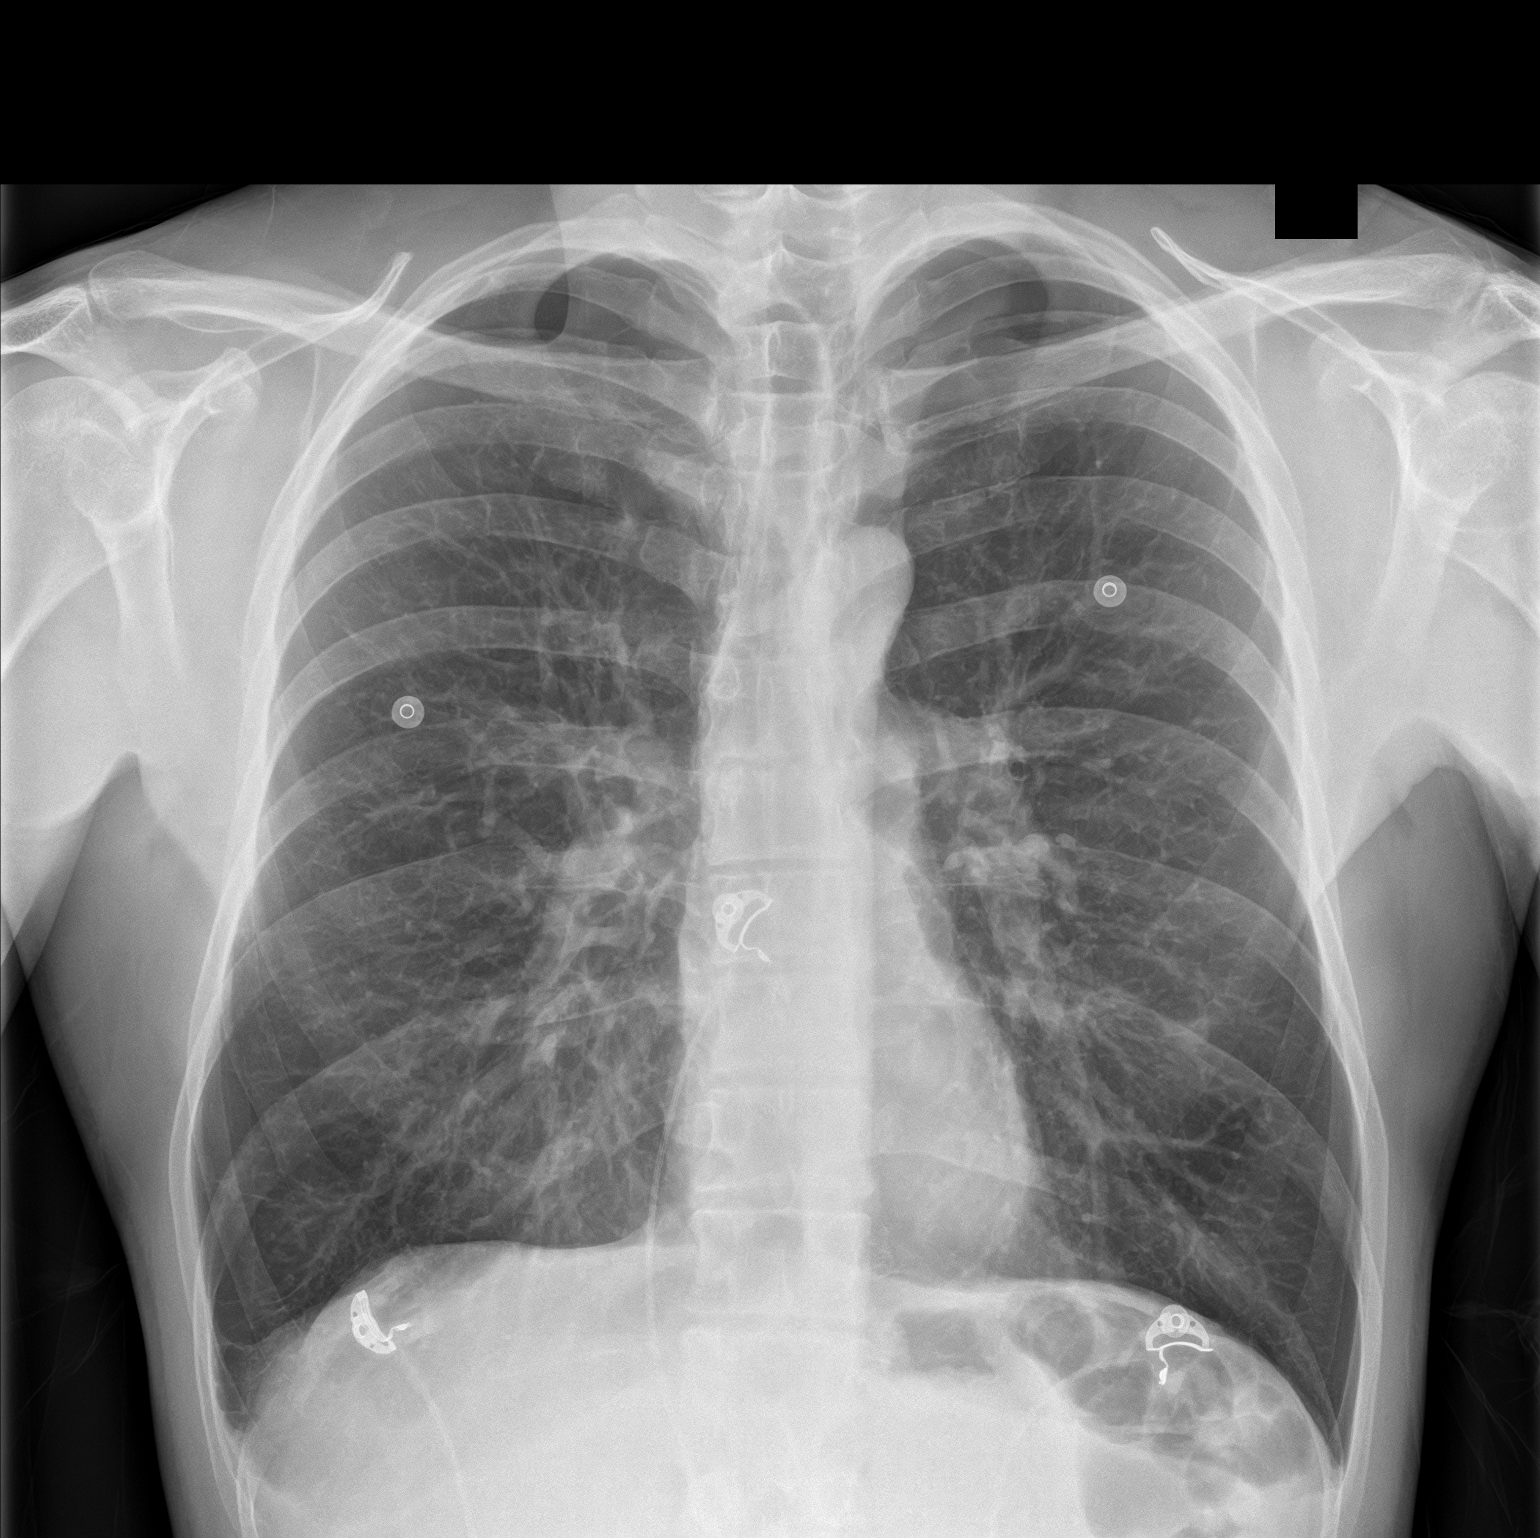
[im 2/2]
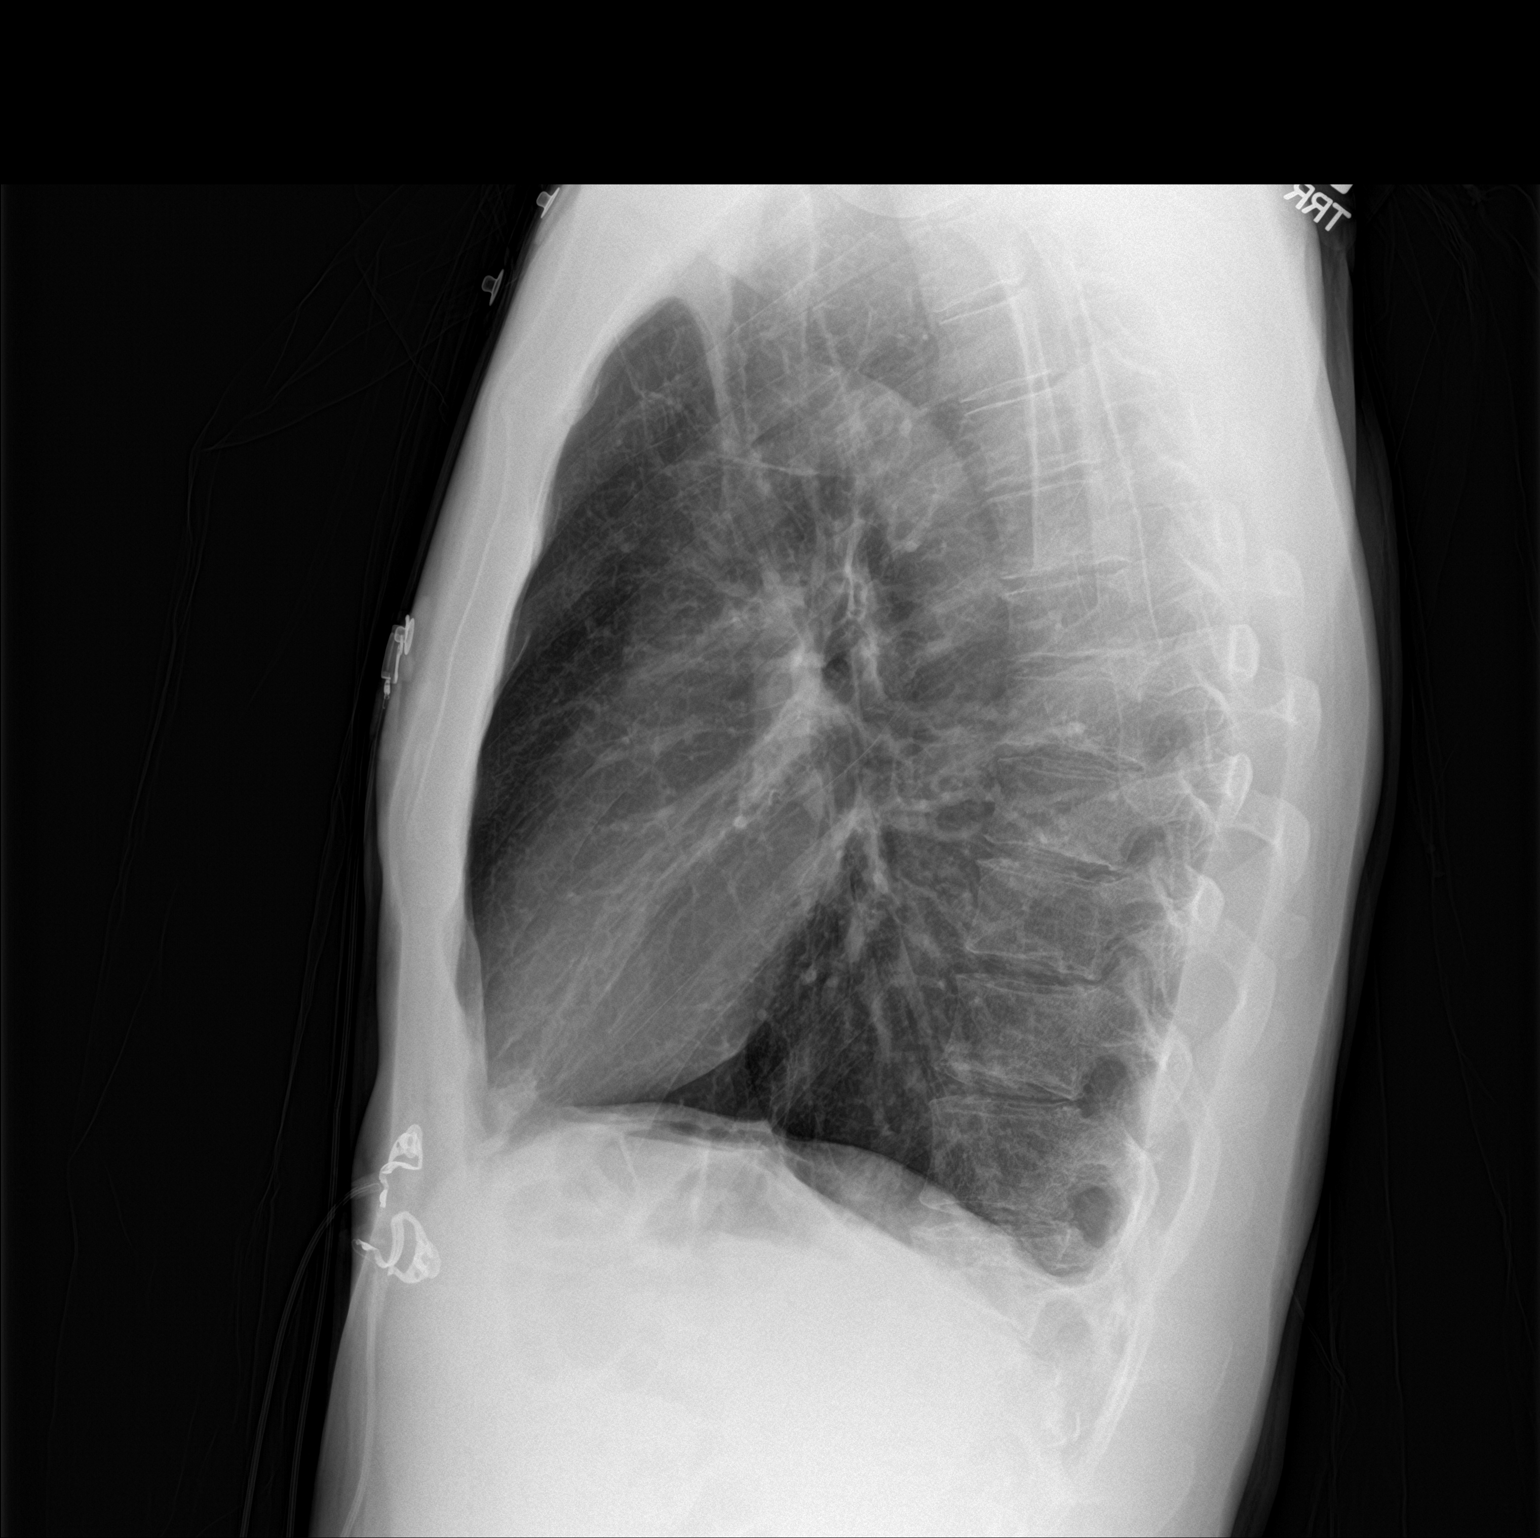

[2 of 2 positions shown; findings below may reference images not displayed]

FINDINGS: Again seen hyperinflation. No focal airspace disease. Unchanged
heart size and mediastinal contours. No pleural effusion or
pneumothorax. No acute osseous abnormalities.
IMPRESSION: Unchanged hyperinflation.  No acute abnormality.

## 2019-12-04 ENCOUNTER — Emergency Department: Payer: Medicare Other

## 2019-12-04 ENCOUNTER — Emergency Department
Admission: EM | Admit: 2019-12-04 | Discharge: 2019-12-04 | Disposition: A | Discharge status: Home / Self Care | Payer: Medicare Other | Attending: Emergency Medicine | Admitting: Emergency Medicine

## 2019-12-04 ENCOUNTER — Other Ambulatory Visit: Payer: Self-pay

## 2019-12-04 DIAGNOSIS — Z79899 Other long term (current) drug therapy: Secondary | ICD-10-CM | POA: Diagnosis not present

## 2019-12-04 DIAGNOSIS — S0083XA Contusion of other part of head, initial encounter: Secondary | ICD-10-CM | POA: Insufficient documentation

## 2019-12-04 DIAGNOSIS — S99911A Unspecified injury of right ankle, initial encounter: Secondary | ICD-10-CM | POA: Diagnosis present

## 2019-12-04 DIAGNOSIS — S1093XA Contusion of unspecified part of neck, initial encounter: Secondary | ICD-10-CM

## 2019-12-04 DIAGNOSIS — Y999 Unspecified external cause status: Secondary | ICD-10-CM | POA: Insufficient documentation

## 2019-12-04 DIAGNOSIS — Y9389 Activity, other specified: Secondary | ICD-10-CM | POA: Insufficient documentation

## 2019-12-04 DIAGNOSIS — J449 Chronic obstructive pulmonary disease, unspecified: Secondary | ICD-10-CM | POA: Insufficient documentation

## 2019-12-04 DIAGNOSIS — I509 Heart failure, unspecified: Secondary | ICD-10-CM | POA: Diagnosis not present

## 2019-12-04 DIAGNOSIS — Y92410 Unspecified street and highway as the place of occurrence of the external cause: Secondary | ICD-10-CM | POA: Diagnosis not present

## 2019-12-04 DIAGNOSIS — F17290 Nicotine dependence, other tobacco product, uncomplicated: Secondary | ICD-10-CM | POA: Insufficient documentation

## 2019-12-04 DIAGNOSIS — S39012A Strain of muscle, fascia and tendon of lower back, initial encounter: Secondary | ICD-10-CM | POA: Insufficient documentation

## 2019-12-04 DIAGNOSIS — S1083XA Contusion of other specified part of neck, initial encounter: Secondary | ICD-10-CM | POA: Diagnosis not present

## 2019-12-04 DIAGNOSIS — S60221A Contusion of right hand, initial encounter: Secondary | ICD-10-CM | POA: Insufficient documentation

## 2019-12-04 DIAGNOSIS — R519 Headache, unspecified: Secondary | ICD-10-CM | POA: Diagnosis not present

## 2019-12-04 DIAGNOSIS — S82831A Other fracture of upper and lower end of right fibula, initial encounter for closed fracture: Secondary | ICD-10-CM | POA: Diagnosis not present

## 2019-12-04 MED ORDER — MELOXICAM 15 MG PO TABS: 15.0000 mg | ORAL_TABLET | Freq: Every day | ORAL | 0 refills | Status: AC
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
## 2019-12-08 ENCOUNTER — Telehealth: Payer: Self-pay | Admitting: Emergency Medicine

## 2019-12-10 DIAGNOSIS — S82891A Other fracture of right lower leg, initial encounter for closed fracture: Secondary | ICD-10-CM | POA: Insufficient documentation

## 2019-12-21 DIAGNOSIS — S8263XA Displaced fracture of lateral malleolus of unspecified fibula, initial encounter for closed fracture: Secondary | ICD-10-CM | POA: Insufficient documentation

## 2020-05-13 DIAGNOSIS — F1120 Opioid dependence, uncomplicated: Secondary | ICD-10-CM

## 2020-05-13 HISTORY — DX: Opioid dependence, uncomplicated: F11.20

## 2020-06-11 ENCOUNTER — Other Ambulatory Visit: Payer: Self-pay

## 2020-06-11 ENCOUNTER — Emergency Department: Payer: Medicare Other

## 2020-06-11 ENCOUNTER — Emergency Department
Admission: EM | Admit: 2020-06-11 | Discharge: 2020-06-11 | Disposition: A | Discharge status: Home / Self Care | Payer: Medicare Other | Attending: Emergency Medicine | Admitting: Emergency Medicine

## 2020-06-11 DIAGNOSIS — R1031 Right lower quadrant pain: Secondary | ICD-10-CM | POA: Insufficient documentation

## 2020-06-11 DIAGNOSIS — Z79899 Other long term (current) drug therapy: Secondary | ICD-10-CM | POA: Insufficient documentation

## 2020-06-11 DIAGNOSIS — N200 Calculus of kidney: Secondary | ICD-10-CM

## 2020-06-11 DIAGNOSIS — Z87891 Personal history of nicotine dependence: Secondary | ICD-10-CM | POA: Diagnosis not present

## 2020-06-11 DIAGNOSIS — I509 Heart failure, unspecified: Secondary | ICD-10-CM | POA: Insufficient documentation

## 2020-06-11 DIAGNOSIS — R1010 Upper abdominal pain, unspecified: Secondary | ICD-10-CM | POA: Diagnosis present

## 2020-06-11 LAB — URINALYSIS, COMPLETE (UACMP) WITH MICROSCOPIC
Bacteria, UA: NONE SEEN
Bilirubin Urine: NEGATIVE
Glucose, UA: NEGATIVE mg/dL
Ketones, ur: NEGATIVE mg/dL
Leukocytes,Ua: NEGATIVE
Nitrite: NEGATIVE
Protein, ur: NEGATIVE mg/dL
RBC / HPF: >50 RBC/hpf — ABNORMAL HIGH (ref 0–5)
Specific Gravity, Urine: 1.012 (ref 1.005–1.030)
Squamous Epithelial / HPF: NONE SEEN (ref 0–5)
pH: 6 (ref 5.0–8.0)

## 2020-06-11 LAB — PROCALCITONIN: Procalcitonin: <0.1 ng/mL

## 2020-06-11 LAB — COMPREHENSIVE METABOLIC PANEL
ALT: 24 U/L (ref 0–44)
AST: 20 U/L (ref 15–41)
Albumin: 3.7 g/dL (ref 3.5–5.0)
Alkaline Phosphatase: 44 U/L (ref 38–126)
Anion gap: 9 (ref 5–15)
BUN: 24 mg/dL — ABNORMAL HIGH (ref 6–20)
CO2: 29 mmol/L (ref 22–32)
Calcium: 9.2 mg/dL (ref 8.9–10.3)
Chloride: 103 mmol/L (ref 98–111)
Creatinine, Ser: 1.41 mg/dL — ABNORMAL HIGH (ref 0.61–1.24)
GFR calc Af Amer: >60 mL/min (ref >60)
GFR calc non Af Amer: >60 mL/min (ref >60)
Glucose, Bld: 105 mg/dL — ABNORMAL HIGH (ref 70–99)
Potassium: 3.6 mmol/L (ref 3.5–5.1)
Sodium: 141 mmol/L (ref 135–145)
Total Bilirubin: 0.7 mg/dL (ref 0.3–1.2)
Total Protein: 6.5 g/dL (ref 6.5–8.1)

## 2020-06-11 LAB — LIPASE, BLOOD: Lipase: 23 U/L (ref 11–51)

## 2020-06-11 LAB — CBC
HCT: 43.7 % (ref 39.0–52.0)
Hemoglobin: 14.4 g/dL (ref 13.0–17.0)
MCH: 28.7 pg (ref 26.0–34.0)
MCHC: 33 g/dL (ref 30.0–36.0)
MCV: 87.1 fL (ref 80.0–100.0)
Platelets: 298 10*3/uL (ref 150–400)
RBC: 5.02 MIL/uL (ref 4.22–5.81)
RDW: 13.5 % (ref 11.5–15.5)
WBC: 10.7 10*3/uL — ABNORMAL HIGH (ref 4.0–10.5)
nRBC: 0 % (ref 0.0–0.2)

## 2020-06-11 LAB — LACTIC ACID, PLASMA: Lactic Acid, Venous: 1.5 mmol/L (ref 0.5–1.9)

## 2020-06-11 MED ORDER — IOHEXOL 300 MG/ML  SOLN
100.0000 mL | Freq: Once | INTRAMUSCULAR | Status: AC | PRN
  Administered 2020-06-11: 100 mL via INTRAVENOUS

## 2020-06-11 MED ORDER — PROMETHAZINE HCL 12.5 MG PO TABS: 12.5000 mg | ORAL_TABLET | Freq: Four times a day (QID) | ORAL | 0 refills | Status: AC | PRN

## 2020-06-11 MED ORDER — SODIUM CHLORIDE 0.9 % IV BOLUS
1000.0000 mL | Freq: Once | INTRAVENOUS | Status: AC
  Administered 2020-06-11: 1000 mL via INTRAVENOUS

## 2020-06-11 MED ORDER — ONDANSETRON HCL 4 MG/2ML IJ SOLN: 4.0000 mg | Freq: Once | INTRAMUSCULAR | Status: AC | PRN

## 2020-06-11 MED ORDER — ONDANSETRON HCL 4 MG/2ML IJ SOLN
INTRAMUSCULAR | Status: AC
  Administered 2020-06-11: 4 mg via INTRAVENOUS
  Filled 2020-06-11: qty 2

## 2020-06-11 MED ORDER — KETOROLAC TROMETHAMINE 30 MG/ML IJ SOLN
15.0000 mg | Freq: Once | INTRAMUSCULAR | Status: AC
  Administered 2020-06-11: 15 mg via INTRAVENOUS
  Filled 2020-06-11: qty 1

## 2020-06-11 MED ORDER — DROPERIDOL 2.5 MG/ML IJ SOLN
5.0000 mg | Freq: Once | INTRAMUSCULAR | Status: AC
  Administered 2020-06-11: 5 mg via INTRAVENOUS

## 2020-06-11 MED ORDER — KETOROLAC TROMETHAMINE 10 MG PO TABS: 10.0000 mg | ORAL_TABLET | Freq: Four times a day (QID) | ORAL | 0 refills | Status: AC | PRN

## 2020-06-11 NOTE — ED Provider Notes (Signed)
-----------------------------------------   7:09 AM on 06/11/2020 -----------------------------------------  Blood pressure (!) 137/98, pulse 64, temperature 97.7 F (36.5 C), temperature source Oral, resp. rate 18, height 5\' 11"  (1.803 m), weight 68 kg, SpO2 98 %.  Assuming care from Dr. .  In short, Reginald Hopkins is a 44 y.o. male with a chief complaint of Abdominal Pain .  Refer to the original H&P for additional details.  The current plan of care is to follow-up CT scan for nephrolithiasis vs appendicitis.  ----------------------------------------- 8:17 AM on 06/11/2020 -----------------------------------------  CT scan shows obstructing stone in the left mid ureter explaining patient's pain.  No other acute pathology noted on CT scan and lab work has been reassuring.  UA shows no evidence of infection.  On reassessment, patient is sleeping comfortably and states pain is improved.  Unfortunately, pain control options are limited in this patient on chronic Suboxone and we will prescribe Toradol as well as Phenergan.  He was counseled to alternate Toradol with Tylenol and schedule follow-up with urology, otherwise return to the ED for new or worsening symptoms.  Patient agrees with plan.    06/13/2020, MD 06/11/20 [phone]
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
## 2020-06-11 NOTE — ED Provider Notes (Addendum)
[facility] Emergency Department Provider Note  ____________________________________________   First MD Initiated Contact with Patient 06/11/20 [phone]     (approximate)  I have reviewed the triage vital signs and the nursing notes.   HISTORY  Chief Complaint Abdominal Pain    HPI Reginald Hopkins is a 44 y.o. male with extensive medical and psychiatric history as listed below who presents by EMS for evaluation of acute onset and severe pain in his right lower quadrant upon waking this morning.  He had no pain when he went to sleep.  The pain is sharp, severe, constant, and nothing in particular makes it better or worse.  He cannot find a position of comfort.  It is accompanied by nausea and at least 2 episodes of vomiting.  The pain does not radiate including no radiation of pain into the penis or scrotum/testicles.  He has no dysuria although he said his urine appears darker than normal.  He reports a history of kidney stones.  No recent trauma.  He denies fever, sore throat, chest pain, shortness of breath, and upper abdominal pain.  He reports that he takes Suboxone regularly and states "I know you cannot give me any narcotics".         Past Medical History:  Diagnosis Date  . Anxiety   . Back pain   . CHF (congestive heart failure) (HCC)   . COPD (chronic obstructive pulmonary disease) (HCC)   . Opioid dependence (HCC) 05/2020   Takes Suboxone  . PTSD (post-traumatic stress disorder)     Patient Active Problem List   Diagnosis Date Noted  . Closed fracture of shaft of metacarpal bone 03/01/2019  . Compression fracture of vertebral column (HCC) 03/01/2019  . Hand pain 03/01/2019  . Low back pain 03/01/2019  . Pain in face 03/01/2019  . Sprain of ankle 03/01/2019  . 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 Opiate abuse, episodic (HCC) 09/14/2018  . 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 Acute respiratory failure with hypoxia (HCC)  09/12/2018  . Acute respiratory failure (HCC) 09/12/2018  . COPD exacerbation (HCC) 02/10/2018  . 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 Explosive personality disorder (HCC)   . Bipolar 2 disorder, major depressive episode (HCC)   . GAD (generalized anxiety disorder)   . Substance induced mood disorder (HCC) 03/24/2016  . Cocaine abuse (HCC) 03/24/2016  . PTSD (post-traumatic stress disorder) 03/24/2016  . COPD (chronic obstructive pulmonary disease) (HCC) 03/24/2016  . Antisocial personality disorder (HCC) 03/24/2016    Past Surgical History:  Procedure Laterality Date  . FACIAL COSMETIC SURGERY    . HAND SURGERY      Prior to Admission medications   Medication Sig Start Date End Date Taking? Authorizing Provider  albuterol (VENTOLIN HFA) 108 (90 Base) MCG/ACT inhaler Inhale 2 puffs into the lungs every 6 (six) hours as needed for wheezing or shortness of breath. 08/20/19  Yes Shaune Pollack, MD  azithromycin (ZITHROMAX) 250 MG tablet Take 250 mg by mouth as directed. 06/06/20  Yes [provider]  clonazePAM (KLONOPIN) 2 MG tablet 3 mg. 1.5 Tablet(s) By Mouth Daily   Yes [provider]  gabapentin (NEURONTIN) 600 MG tablet Take 600 mg by mouth 3 (three) times daily. 05/30/20  Yes [provider]  INCRUSE ELLIPTA 62.5 MCG/INH AEPB 1 puff daily. 05/19/20  Yes [provider]  levofloxacin (LEVAQUIN) 750 MG tablet Take 750 mg by mouth daily. 05/30/20  Yes [provider]  megestrol (MEGACE) 40 MG tablet Take 40 mg by mouth 2 (two) times daily. 04/30/20  Yes [provider]  meloxicam (MOBIC) 15 MG tablet Take 1 tablet (15 mg total) by mouth daily. 12/04/19  Yes Cuthriell, Delorise RoyalsJonathan D, PA-C  naloxone [facility](NARCAN) 0.4 MG/ML injection As needed for opiate overdose 08/20/19  Yes Shaune PollackIsaacs, Cameron, MD  predniSONE (DELTASONE) 20 MG tablet Take 20 mg by mouth 2 (two) times daily with a meal.    Yes [provider]  amitriptyline (ELAVIL) 25 MG tablet TAKE 1 TABLET BY MOUTH EVERYDAY AT BEDTIME 03/13/19   Stoioff, Verna CzechScott C, MD    Allergies Patient has no known allergies.  Family History  Problem Relation Age of Onset  . Hypertension Father     Social History Social History   Tobacco Use  . Smoking status: Former Games developermoker  . Smokeless tobacco: Current User  Vaping Use  . Vaping Use: Never used  Substance Use Topics  . Alcohol use: Yes  . Drug use: Not on file    Review of Systems Constitutional: No fever/chills Eyes: No visual changes. ENT: No sore throat. Cardiovascular: Denies chest pain. Respiratory: Denies shortness of breath. Gastrointestinal: Acute onset right lower quadrant abdominal pain.  Nausea and at least 2 episodes of vomiting.  No diarrhea.  No constipation. Genitourinary: Negative for dysuria.  Urine appears darker than usual.  No testicular/scrotal pain. Musculoskeletal: Negative for neck pain.  Negative for back pain. Integumentary: Negative for rash. Neurological: Negative for headaches, focal weakness or numbness.   ____________________________________________   PHYSICAL EXAM:  VITAL SIGNS: ED Triage Vitals  Enc Vitals Group     BP 06/11/20 0619 (!) 137/98     Pulse Rate 06/11/20 0619 64     Resp 06/11/20 0619 18     Temp 06/11/20 0619 97.7 F (36.5 C)     Temp Source 06/11/20 0619 Oral     SpO2 06/11/20 0619 98 %     Weight 06/11/20 0616 68 kg (150 lb)     Height 06/11/20 0616 1.803 m (5\' 11" )     Head Circumference --      Peak Flow --      Pain Score 06/11/20 0616 10     Pain Loc --      Pain Edu? --      Excl. in GC? --     Constitutional: Alert and oriented.  Appears to be in significant distress. Eyes: Conjunctivae are normal.  Head: Atraumatic. Nose: No congestion/rhinnorhea. Mouth/Throat: Patient is wearing a mask. Neck: No stridor.  No meningeal signs.   Cardiovascular: Normal rate, regular rhythm. Good peripheral  circulation. Grossly normal heart sounds. Respiratory: Normal respiratory effort.  No retractions. Gastrointestinal: Soft and nondistended.  Localized tenderness to palpation consistent with localized peritonitis in the right lower quadrant.  The muscles feel firmer in that area than on the left.  He has guarding and some rebound.  Thin and muscular body habitus allows for good examination. Musculoskeletal: No lower extremity tenderness nor edema. No gross deformities of extremities. Neurologic:  Normal speech and language. No gross focal neurologic deficits are appreciated.  Skin:  Skin is warm, dry and intact. Psychiatric: Mood and affect are agitated and irritable in the setting of acute and severe pain.  ____________________________________________   LABS (all labs ordered are listed, but only abnormal results are displayed)  Labs Reviewed  COMPREHENSIVE METABOLIC PANEL - Abnormal; Notable for the following components:  Result Value   Glucose, Bld 105 (*)    BUN 24 (*)    Creatinine, Ser 1.41 (*)    All other components within normal limits  CBC - Abnormal; Notable for the following components:   WBC 10.7 (*)    All other components within normal limits  URINALYSIS, COMPLETE (UACMP) WITH MICROSCOPIC - Abnormal; Notable for the following components:   Color, Urine YELLOW (*)    APPearance CLOUDY (*)    Hgb urine dipstick LARGE (*)    RBC / HPF >50 (*)    All other components within normal limits  LIPASE, BLOOD  LACTIC ACID, PLASMA  LACTIC ACID, PLASMA  PROCALCITONIN   ____________________________________________  EKG  None - EKG not ordered by ED physician.  Prior EKG reveals QTC of about 422 ms. ____________________________________________  RADIOLOGY I, Loleta Rose, personally viewed and evaluated these images (plain radiographs) as part of my medical decision making, as well as reviewing the written report by the radiologist.  ED MD interpretation: CT of the  abdomen and pelvis is pending at the time of ED transfer of care.  Official radiology report(s): No results found.  ____________________________________________   PROCEDURES   Procedure(s) performed (including Critical Care):  Procedures   ____________________________________________   INITIAL IMPRESSION / MDM / ASSESSMENT AND PLAN / ED COURSE  As part of my medical decision making, I reviewed the following data within the electronic MEDICAL RECORD NUMBER Nursing notes reviewed and incorporated, Labs reviewed , Old chart reviewed, Patient signed out to Dr. Larinda Buttery, Notes from prior ED visits and  Controlled Substance Database   Differential diagnosis includes, but is not limited to, kidney/ureteral stone, UTI/pyelonephritis, appendicitis, diverticulitis, less likely testicular torsion.  The patient's pain is very specific to his right lower quadrant and he has what is arguably localized peritonitis in that region.  He has no pain radiating down to his genitals.  However the pain was very acute in onset and he cannot find a position of comfort.  I suspect he is suffering from a ureteral stone.  Lab work is pending and I will order a CT scan with IV contrast for optimal evaluation of the appendix as well, but I think a stone and makes the most sense.  The patient commented that he is on Suboxone and he knows we cannot give him narcotics.  As an alternative I have ordered Toradol 15 mg IV and droperidol 5 mg IV which should help with the pain, nausea/vomiting, and work as a Automotive engineer as the patient is quite agitated currently as a result of the pain.  I have also ordered 1 L of normal saline IV bolus.  Transferring ED care to Dr. Larinda Buttery to follow-up on the lab work and CT scan. ____________________________________________  FINAL CLINICAL IMPRESSION(S) / ED DIAGNOSES  Final diagnoses:  Pain, abdominal, RLQ     MEDICATIONS GIVEN DURING THIS VISIT:  Medications  ondansetron  (ZOFRAN) injection 4 mg (4 mg Intravenous Given 06/11/20 0621)  droperidol (INAPSINE) 2.5 MG/ML injection 5 mg (5 mg Intravenous Given 06/11/20 0648)  ketorolac (TORADOL) 30 MG/ML injection 15 mg (15 mg Intravenous Given 06/11/20 0648)  sodium chloride 0.9 % bolus 1,000 mL (1,000 mLs Intravenous New Bag/Given 06/11/20 4097)     ED Discharge Orders    None      *Please note:  Curren Mohrmann was evaluated in Emergency Department on 06/11/2020 for the symptoms described in the history of present illness. He was evaluated in the context  of the global COVID-19 pandemic, which necessitated consideration that the patient might be at risk for infection with the SARS-CoV-2 virus that causes COVID-19. Institutional protocols and algorithms that pertain to the evaluation of patients at risk for COVID-19 are in a state of rapid change based on information released by regulatory bodies including the CDC and federal and state organizations. These policies and algorithms were followed during the patient's care in the ED.  Some ED evaluations and interventions may be delayed as a result of limited staffing during and after the pandemic.*  Note:  This document was prepared using Dragon voice recognition software and may include unintentional dictation errors.    Loleta Rose, MD 06/11/20 [phone]

## 2020-06-11 NOTE — ED Notes (Signed)
Pt wheeled outside to await transport home. First nurse aware.

## 2020-06-18 ENCOUNTER — Ambulatory Visit: Payer: Medicare Other | Admitting: Urology

## 2020-06-19 ENCOUNTER — Ambulatory Visit: Payer: Medicare Other | Admitting: Urology

## 2020-06-19 ENCOUNTER — Encounter: Payer: Self-pay | Admitting: Urology

## 2020-07-16 ENCOUNTER — Other Ambulatory Visit: Payer: Self-pay

## 2020-07-16 ENCOUNTER — Ambulatory Visit (INDEPENDENT_AMBULATORY_CARE_PROVIDER_SITE_OTHER): Payer: Medicare Other | Admitting: Urology

## 2020-07-16 ENCOUNTER — Encounter: Payer: Self-pay | Admitting: Urology

## 2020-07-16 VITALS — BP 136/87 | HR 105 | Ht 71.0 in | Wt 146.0 lb

## 2020-07-16 DIAGNOSIS — N201 Calculus of ureter: Secondary | ICD-10-CM

## 2020-07-16 MED ORDER — TAMSULOSIN HCL 0.4 MG PO CAPS: 0.4000 mg | ORAL_CAPSULE | Freq: Every day | ORAL | 0 refills | Status: DC

## 2020-07-16 MED ORDER — OXYCODONE-ACETAMINOPHEN 5-325 MG PO TABS: 1.0000 | ORAL_TABLET | Freq: Four times a day (QID) | ORAL | 0 refills | Status: AC | PRN

## 2020-07-16 MED ORDER — OXYCODONE-ACETAMINOPHEN 7.5-325 MG PO TABS: 1.0000 | ORAL_TABLET | ORAL | 0 refills | Status: DC | PRN

## 2020-07-16 MED ORDER — TAMSULOSIN HCL 0.4 MG PO CAPS: 0.4000 mg | ORAL_CAPSULE | Freq: Every day | ORAL | 1 refills | Status: AC

## 2020-07-16 NOTE — Progress Notes (Signed)
07/16/2020 11:00 AM   Reginald Hopkins September 07, 1976 [id number]  Referring provider: Nada Boozer, MD [address] [place],  [place] [address]  Chief Complaint  Patient presents with  . Nephrolithiasis    HPI:  He underwent CT scan of the abdomen and pelvis 06/11/2020 which revealed a 6 mm right proximal ureteral stone.  There were no other significant stones.  Punctate left renal stones.  White count was 10.7, UA with no bacteria greater than 50 red cells, creatinine 1.4.  He is here today and has significant right LQ pain and urgency. Pain radiates to right groin. He has not seen the stone pass and is not on tamsulosin. He is staying hydrated and drinking lemon juice.   He has a h/o kidney stones and passed a 7 mm left in 2020. Reginald Hopkins is a recovering addict, has fought homelessness and is now about to buy a manufactured home of his own. He sees PA Dentist at [facility].   PMH: Past Medical History:  Diagnosis Date  . Anxiety   . Back pain   . CHF (congestive heart failure) (HCC)   . COPD (chronic obstructive pulmonary disease) (HCC)   . Opioid dependence (HCC) 05/2020   Takes Suboxone  . PTSD (post-traumatic stress disorder)     Surgical History: Past Surgical History:  Procedure Laterality Date  . FACIAL COSMETIC SURGERY    . HAND SURGERY      Home Medications:  Allergies as of 07/16/2020   No Known Allergies     Medication List       Accurate as of July 16, 2020 11:00 AM. If you have any questions, ask your nurse or doctor.        albuterol 108 (90 Base) MCG/ACT inhaler Commonly known as: VENTOLIN HFA Inhale 2 puffs into the lungs every 6 (six) hours as needed for wheezing or shortness of breath.   amitriptyline 25 MG tablet Commonly known as: ELAVIL TAKE 1 TABLET BY MOUTH EVERYDAY AT BEDTIME What changed:   how much to take  how to take this  when to take this   azithromycin 250 MG tablet Commonly known as: ZITHROMAX Take 250 mg by mouth  as directed.   clonazePAM 2 MG tablet Commonly known as: KLONOPIN 3 mg. 1.5 Tablet(s) By Mouth Daily   gabapentin 600 MG tablet Commonly known as: NEURONTIN Take 600 mg by mouth 3 (three) times daily.   Incruse Ellipta 62.5 MCG/INH Aepb Generic drug: umeclidinium bromide 1 puff daily.   ketorolac 10 MG tablet Commonly known as: TORADOL Take 1 tablet (10 mg total) by mouth every 6 (six) hours as needed.   levofloxacin 750 MG tablet Commonly known as: LEVAQUIN Take 750 mg by mouth daily.   megestrol 40 MG tablet Commonly known as: MEGACE Take 40 mg by mouth 2 (two) times daily.   meloxicam 15 MG tablet Commonly known as: MOBIC Take 1 tablet (15 mg total) by mouth daily.   naloxone 0.4 MG/ML injection Commonly known as: NARCAN As needed for opiate overdose   predniSONE 20 MG tablet Commonly known as: DELTASONE Take 20 mg by mouth 2 (two) times daily with a meal.   promethazine 12.5 MG tablet Commonly known as: PHENERGAN Take 1 tablet (12.5 mg total) by mouth every 6 (six) hours as needed for nausea or vomiting.       Allergies: No Known Allergies  Family History: Family History  Problem Relation Age of Onset  . Hypertension Father  Social History:  reports that he has quit smoking. He uses smokeless tobacco. He reports current alcohol use.  Drugs: Cocaine and Marijuana.   Physical Exam: There were no vitals taken for this visit.  Constitutional:  Alert and oriented, No acute distress. Moving around, looks in pain.  HEENT: [place] AT, moist mucus membranes.  Trachea midline, no masses. Cardiovascular: No clubbing, cyanosis, or edema. Respiratory: Normal respiratory effort, no increased work of breathing. GI: Abdomen is soft, nontender, nondistended, no abdominal masses GU: No CVA tenderness Lymph: No cervical or inguinal lymphadenopathy. Skin: No rashes, bruises or suspicious lesions. Neurologic: Grossly intact, no focal deficits, moving all 4  extremities. Psychiatric: Normal mood and affect.   Laboratory Data: Lab Results  Component Value Date   WBC 10.7 (H) 06/11/2020   HGB 14.4 06/11/2020   HCT 43.7 06/11/2020   MCV 87.1 06/11/2020   PLT 298 06/11/2020    Lab Results  Component Value Date   CREATININE 1.41 (H) 06/11/2020    No results found for: PSA  No results found for: TESTOSTERONE  No results found for: HGBA1C  Urinalysis    Component Value Date/Time   COLORURINE YELLOW (A) 06/11/2020 0620   APPEARANCEUR CLOUDY (A) 06/11/2020 0620   APPEARANCEUR Clear 06/21/2014 2148   LABSPEC 1.012 06/11/2020 0620   LABSPEC 1.023 06/21/2014 2148   PHURINE 6.0 06/11/2020 0620   GLUCOSEU NEGATIVE 06/11/2020 0620   GLUCOSEU Negative 06/21/2014 2148   HGBUR LARGE (A) 06/11/2020 0620   BILIRUBINUR NEGATIVE 06/11/2020 0620   BILIRUBINUR Negative 06/21/2014 2148   KETONESUR NEGATIVE 06/11/2020 0620   PROTEINUR NEGATIVE 06/11/2020 0620   NITRITE NEGATIVE 06/11/2020 0620   LEUKOCYTESUR NEGATIVE 06/11/2020 0620   LEUKOCYTESUR Negative 06/21/2014 2148    Lab Results  Component Value Date   BACTERIA NONE SEEN 06/11/2020    Pertinent Imaging: CT scan  Results for orders placed during the hospital encounter of 12/20/18  DG Abd 1 View  Narrative CLINICAL DATA:  Follow-up of urinary tract calculi.  EXAM: ABDOMEN - 1 VIEW  COMPARISON:  CT of 12/12/2018  FINDINGS: Two views of the abdomen. Large colonic stool burden, including overlying the left kidney. Convex right lumbar spine curvature. Non-obstructive bowel gas pattern.  No correlate for the left renal or UPJ stones on plain film. A left pelvic calcification is likely a phlebolith, when correlated with prior CT.  L1 compression fracture is incompletely and suboptimally evaluated.  IMPRESSION: Suboptimal evaluation of the left abdomen secondary to overlying gas and stool. Given this factor, the left renal and ureteropelvic junction calculi are not  readily apparent.   Electronically Signed By: Jeronimo Greaves M.D. On: 12/20/2018 12:41  No results found for this or any previous visit.  No results found for this or any previous visit.  No results found for this or any previous visit.  No results found for this or any previous visit.  No results found for this or any previous visit.  No results found for this or any previous visit.  Results for orders placed during the hospital encounter of 01/25/19  CT RENAL STONE STUDY  Narrative CLINICAL DATA:  Says right flank pain. Say he had kidney stones and went to urologist but they could not find the stones. Says he continues to have urgency, blood in urine, testicle pain  EXAM: CT ABDOMEN AND PELVIS WITHOUT CONTRAST  TECHNIQUE: Multidetector CT imaging of the abdomen and pelvis was performed following the standard protocol without IV contrast.  COMPARISON:  CT  of the abdomen and pelvis on 12/12/2018  FINDINGS: Lower chest: No acute abnormality.  Hepatobiliary: Contracted gallbladder. Liver is homogeneous without focal mass.  Pancreas: Unremarkable. No pancreatic ductal dilatation or surrounding inflammatory changes.  Spleen: Normal in size without focal abnormality.  Adrenals/Urinary Tract: 2 millimeter calculus is identified in the midpole region of the LEFT kidney. No ureteral stones. The bladder and visualized portion of the urethra are normal.  Stomach/Bowel: The stomach is distended by debris. Small bowel loops are normal in appearance. There is a significant stool burden. Loops of colon are not dilated. The appendix is well seen and has a normal appearance.  Vascular/Lymphatic: No significant vascular findings are present. No enlarged abdominal or pelvic lymph nodes.  Reproductive: Prostate is unremarkable.  Other: No abdominal wall hernia or abnormality. No abdominopelvic ascites.  Musculoskeletal: Stable appearance of chronic L1  compression fracture.  IMPRESSION: 1. Nonobstructing 2 millimeter calculus in the midpole region of the LEFT kidney. 2. Significant stool burden. 3. Normal appendix. 4. Chronic L1 compression fracture.   Electronically Signed By: Norva Pavlov M.D. On: 01/25/2019 17:24   Assessment & Plan:    1. Right ureteral stone -  Discussed nature r/b of continue stone passage, off label use of alpha blockers, right URS or ESWL. He will continue stone passage. He declined torodal - "insurance won't cover it". I recommended NSAID but he has "four stomach ulcers" and already has diclofenac for his back. I spoke with PA Melvenia Beam and he is agreeable pt OK for some percocet and to hold the Suboxone when he takes it. I sent a rx for tamsulosin as well. Pt wanted meds sent to a different pharmacy and Crysta called [facility] and told them not to fill the percocet and tams. I resent them.  - Urinalysis, Complete   No follow-ups on file.  Jerilee Field, MD  [facility] [address] [place], [place] [address] [phone]

## 2020-07-16 NOTE — Patient Instructions (Signed)

## 2020-07-18 LAB — URINALYSIS, COMPLETE
Bilirubin, UA: NEGATIVE
Leukocytes,UA: NEGATIVE
Nitrite, UA: NEGATIVE
Specific Gravity, UA: 1.015 (ref 1.005–1.030)
Urobilinogen, Ur: 2 mg/dL — ABNORMAL HIGH (ref 0.2–1.0)
pH, UA: 5 (ref 5.0–7.5)

## 2020-07-18 LAB — MICROSCOPIC EXAMINATION: Bacteria, UA: NONE SEEN

## 2020-08-01 ENCOUNTER — Ambulatory Visit: Payer: Medicare Other | Admitting: Urology

## 2020-08-01 ENCOUNTER — Encounter: Payer: Self-pay | Admitting: Urology

## 2021-01-20 ENCOUNTER — Other Ambulatory Visit: Payer: Self-pay

## 2021-01-20 ENCOUNTER — Encounter: Payer: Self-pay | Admitting: *Deleted

## 2021-01-20 DIAGNOSIS — S61211A Laceration without foreign body of left index finger without damage to nail, initial encounter: Secondary | ICD-10-CM | POA: Insufficient documentation

## 2021-01-20 DIAGNOSIS — W268XXA Contact with other sharp object(s), not elsewhere classified, initial encounter: Secondary | ICD-10-CM | POA: Insufficient documentation

## 2021-01-20 DIAGNOSIS — R059 Cough, unspecified: Secondary | ICD-10-CM | POA: Diagnosis not present

## 2021-01-20 DIAGNOSIS — Z87891 Personal history of nicotine dependence: Secondary | ICD-10-CM | POA: Diagnosis not present

## 2021-01-20 DIAGNOSIS — Z79899 Other long term (current) drug therapy: Secondary | ICD-10-CM | POA: Diagnosis not present

## 2021-01-20 DIAGNOSIS — R079 Chest pain, unspecified: Secondary | ICD-10-CM | POA: Insufficient documentation

## 2021-01-20 DIAGNOSIS — Z23 Encounter for immunization: Secondary | ICD-10-CM | POA: Insufficient documentation

## 2021-01-20 DIAGNOSIS — R0602 Shortness of breath: Secondary | ICD-10-CM | POA: Insufficient documentation

## 2021-01-20 DIAGNOSIS — I509 Heart failure, unspecified: Secondary | ICD-10-CM | POA: Insufficient documentation

## 2021-01-20 DIAGNOSIS — J449 Chronic obstructive pulmonary disease, unspecified: Secondary | ICD-10-CM | POA: Insufficient documentation

## 2021-01-20 DIAGNOSIS — Y92009 Unspecified place in unspecified non-institutional (private) residence as the place of occurrence of the external cause: Secondary | ICD-10-CM | POA: Insufficient documentation

## 2021-01-20 NOTE — ED Triage Notes (Signed)
Laceration to left index finger from a razor. Pt reports being unable to control bleeding for the past three hours. Raxor was a new blade and pt reports having bleeding the wound at home.

## 2021-01-21 ENCOUNTER — Emergency Department
Admission: EM | Admit: 2021-01-21 | Discharge: 2021-01-21 | Discharge status: Against Medical Advice | Payer: Medicare Other | Attending: Emergency Medicine | Admitting: Emergency Medicine

## 2021-01-21 DIAGNOSIS — S61211A Laceration without foreign body of left index finger without damage to nail, initial encounter: Secondary | ICD-10-CM

## 2021-01-21 DIAGNOSIS — R0602 Shortness of breath: Secondary | ICD-10-CM

## 2021-01-21 DIAGNOSIS — R079 Chest pain, unspecified: Secondary | ICD-10-CM

## 2021-01-21 MED ORDER — TETANUS-DIPHTH-ACELL PERTUSSIS 5-2.5-18.5 LF-MCG/0.5 IM SUSY
0.5000 mL | PREFILLED_SYRINGE | Freq: Once | INTRAMUSCULAR | Status: AC
  Administered 2021-01-21: 0.5 mL via INTRAMUSCULAR
  Filled 2021-01-21: qty 0.5

## 2021-01-21 MED ORDER — LIDOCAINE HCL (PF) 1 % IJ SOLN
INTRAMUSCULAR | Status: AC
  Filled 2021-01-21: qty 5

## 2021-01-21 NOTE — ED Notes (Signed)
Pt refused EKG.

## 2021-01-21 NOTE — ED Provider Notes (Signed)
[facility] Emergency Department Provider Note  ____________________________________________   Event Date/Time   First MD Initiated Contact with Patient 01/21/21 0111     (approximate)  I have reviewed the triage vital signs and the nursing notes.   HISTORY  Chief Complaint Laceration   HPI Parris Cudworth is a 45 y.o. male the past medical history of anxiety, CHF, COPD, PTSD, and prior opioid abuse currently on Suboxone who presents for assessment of multiple complaints primarily a laceration he sustained any other part arrival using a razor blade to the tip of his left index finger.  Patient states he was bleeding significantly before arrival but it was controlled with some pressure.  States he put some hydroperoxide prior to arrival.  Denies any other recent injuries.  He also notes he has had some shortness of breath chest pain and cough over the last couple of weeks and thinks it may be his COPD.  He denies any headache, earache, fevers, vomiting, diarrhea, dysuria, rash, abdominal pain back pain or any other acute sick symptoms.  Denies using any illegal drugs or regular EtOH use.  Denies any current tobacco abuse but endorses remote tobacco abuse.         Past Medical History:  Diagnosis Date  . Anxiety   . Back pain   . CHF (congestive heart failure) (HCC)   . COPD (chronic obstructive pulmonary disease) (HCC)   . Opioid dependence (HCC) 05/2020   Takes Suboxone  . PTSD (post-traumatic stress disorder)     Patient Active Problem List   Diagnosis Date Noted  . Closed fracture of lateral malleolus 12/21/2019  . Closed fracture of right ankle 12/10/2019  . Closed fracture of shaft of metacarpal bone 03/01/2019  . Compression fracture of vertebral column (HCC) 03/01/2019  . Hand pain 03/01/2019  . Low back pain 03/01/2019  . Pain in face 03/01/2019  . Sprain of ankle 03/01/2019  . 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 Opiate abuse,  episodic (HCC) 09/14/2018  . 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 Acute respiratory failure with hypoxia (HCC) 09/12/2018  . Acute respiratory failure (HCC) 09/12/2018  . COPD exacerbation (HCC) 02/10/2018  . 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 Explosive personality disorder (HCC)   . Bipolar 2 disorder, major depressive episode (HCC)   . GAD (generalized anxiety disorder)   . Substance induced mood disorder (HCC) 03/24/2016  . Cocaine abuse (HCC) 03/24/2016  . PTSD (post-traumatic stress disorder) 03/24/2016  . COPD (chronic obstructive pulmonary disease) (HCC) 03/24/2016  . Antisocial personality disorder (HCC) 03/24/2016    Past Surgical History:  Procedure Laterality Date  . FACIAL COSMETIC SURGERY    . HAND SURGERY      Prior to Admission medications   Medication Sig Start Date End Date Taking? Authorizing Provider  albuterol (VENTOLIN HFA) 108 (90 Base) MCG/ACT inhaler Inhale 2 puffs into the lungs every 6 (six) hours as needed for wheezing or shortness of breath. 08/20/19   Shaune Pollack, MD  amitriptyline (ELAVIL) 25 MG tablet TAKE 1 TABLET BY MOUTH EVERYDAY AT BEDTIME Patient taking differently: Take 25 mg by mouth at bedtime. TAKE 1 TABLET BY MOUTH EVERYDAY AT BEDTIME 03/13/19   Stoioff, Verna Czech, MD  azithromycin (ZITHROMAX) 250 MG tablet Take 250 mg by mouth as directed. 06/06/20   [provider]  buprenorphine-naloxone (SUBOXONE) 8-2 mg SUBL SL tablet 1 tablet 3 (three) times daily. 07/01/20   [provider]  clonazePAM (KLONOPIN) 2 MG tablet 3 mg. 1.5 Tablet(s) By Mouth Daily    [provider]  cyclobenzaprine (FLEXERIL) 10 MG tablet Take 10 mg by mouth 3 (three) times daily as needed. 04/07/20   [provider]  diclofenac (VOLTAREN) 75 MG EC tablet Take 75 mg by mouth 2 (two) times daily. 07/14/20   [provider]  Fluticasone-Salmeterol (ADVAIR DISKUS) 250-50 MCG/DOSE  AEPB Advair Diskus 250 mcg-50 mcg/dose powder for inhalation    [provider]  gabapentin (NEURONTIN) 600 MG tablet Take 600 mg by mouth 3 (three) times daily. 05/30/20   [provider]  INCRUSE ELLIPTA 62.5 MCG/INH AEPB 1 puff daily. 05/19/20   [provider]  ketorolac (TORADOL) 10 MG tablet Take 1 tablet (10 mg total) by mouth every 6 (six) hours as needed. 06/11/20   Chesley Noon, MD  levofloxacin (LEVAQUIN) 750 MG tablet Take 750 mg by mouth daily. 05/30/20   [provider]  megestrol (MEGACE) 40 MG tablet Take 40 mg by mouth 2 (two) times daily. 04/30/20   [provider]  meloxicam (MOBIC) 15 MG tablet Take 1 tablet (15 mg total) by mouth daily. 12/04/19   Cuthriell, Delorise Royals, PA-C  naloxone Jonelle Sports) 0.4 MG/ML injection As needed for opiate overdose 08/20/19   Shaune Pollack, MD  oxyCODONE-acetaminophen (PERCOCET) 5-325 MG tablet Take 1-2 tablets by mouth every 6 (six) hours as needed for severe pain. 07/16/20 07/16/21  Jerilee Field, MD  predniSONE (DELTASONE) 20 MG tablet Take 20 mg by mouth 2 (two) times daily with a meal.     [provider]  promethazine (PHENERGAN) 12.5 MG tablet Take 1 tablet (12.5 mg total) by mouth every 6 (six) hours as needed for nausea or vomiting. 06/11/20   Chesley Noon, MD  rOPINIRole (REQUIP) 0.5 MG tablet Take 0.5 mg by mouth daily. 02/05/20   [provider]  tamsulosin (FLOMAX) 0.4 MG CAPS capsule Take 1 capsule (0.4 mg total) by mouth daily after supper. 07/16/20   Jerilee Field, MD  traMADol (ULTRAM) 50 MG tablet Take 100 mg by mouth every 6 (six) hours as needed. 01/25/20   [provider]    Allergies Patient has no known allergies.  Family History  Problem Relation Age of Onset  . Hypertension Father     Social History Social History   Tobacco Use  . Smoking status: Former Games developer  . Smokeless tobacco: Current User  Vaping Use  . Vaping Use: Never used  Substance  Use Topics  . Alcohol use: Yes    Review of Systems  Review of Systems  Constitutional: Negative for chills and fever.  HENT: Negative for sore throat.   Eyes: Negative for pain.  Respiratory: Positive for cough and shortness of breath. Negative for stridor.   Cardiovascular: Positive for chest pain.  Gastrointestinal: Negative for vomiting.  Musculoskeletal: Positive for myalgias ( tip of L 2nd digit).  Skin: Negative for rash.  Neurological: Negative for seizures, loss of consciousness and headaches.  Psychiatric/Behavioral: Negative for suicidal ideas.  All other systems reviewed and are negative.     ____________________________________________   PHYSICAL EXAM:  VITAL SIGNS: ED Triage Vitals  Enc Vitals Group     BP 01/20/21 2344 (!) 124/91     Pulse Rate 01/20/21 2344 (!) 112     Resp 01/20/21 2344 16     Temp 01/20/21 2344 97.9 F (36.6 C)     Temp Source 01/20/21 2344 Oral  SpO2 01/20/21 2344 97 %     Weight --      Height 01/20/21 2345 5\' 11"  (1.803 m)     Head Circumference --      Peak Flow --      Pain Score --      Pain Loc --      Pain Edu? --      Excl. in GC? --    Vitals:   01/20/21 2344  BP: (!) 124/91  Pulse: (!) 112  Resp: 16  Temp: 97.9 F (36.6 C)  SpO2: 97%   Physical Exam Vitals and nursing note reviewed.  Constitutional:      Appearance: He is well-developed and well-nourished.  HENT:     Head: Normocephalic and atraumatic.     Right Ear: External ear normal.     Left Ear: External ear normal.     Nose: Nose normal.  Eyes:     Conjunctiva/sclera: Conjunctivae normal.  Cardiovascular:     Rate and Rhythm: Regular rhythm. Tachycardia present.     Heart sounds: No murmur heard.   Pulmonary:     Effort: Pulmonary effort is normal. No respiratory distress.     Breath sounds: Normal breath sounds.  Abdominal:     Palpations: Abdomen is soft.     Tenderness: There is no abdominal tenderness.  Musculoskeletal:         General: No edema.     Cervical back: Neck supple.  Skin:    General: Skin is warm and dry.     Capillary Refill: Capillary refill takes less than 2 seconds.  Neurological:     Mental Status: He is alert and oriented to person, place, and time.  Psychiatric:        Mood and Affect: Mood and affect and mood normal.     Patient has approximately 1 cm hemostatic laceration along the lateral aspect of his left second digit.  There is also a less than 0.5 cm or superficial linear lack over the lateral aspect between the interphalangeal and distal interphalangeal joints of the second digit.  No other evidence of trauma.  Patient is able to flex and extend the digit against resistance.  Is able to also flex and extend all other digits and hand against resistance.  2+ radial pulse.  Sensation intact in the distribution of the radial ulnar and median nerves.  No other evidence of trauma to the palm or other digits.  Nailbed is not involved with there is evidence of prior injury with some scarring of the nail. ____________________________________________   LABS (all labs ordered are listed, but only abnormal results are displayed)  Labs Reviewed - No data to display ____________________________________________  EKG   ____________________________________________  RADIOLOGY  ED MD interpretation:  Official radiology report(s): No results found.  ____________________________________________   PROCEDURES  Procedure(s) performed (including Critical Care):  04/08/22Marland KitchenLaceration Repair  Date/Time: 01/21/2021 1:31 AM Performed by: 03/21/2021, MD Authorized by: Gilles Chiquito, MD   Consent:    Consent obtained:  Verbal   Consent given by:  Patient   Risks, benefits, and alternatives were discussed: yes     Risks discussed:  Pain and poor wound healing   Alternatives discussed:  No treatment and observation Laceration details:    Location:  Finger   Finger location:  L index finger    Length (cm):  1 Exploration:    Wound exploration: wound explored through full range of motion  Contaminated: no   Treatment:    Area cleansed with:  Soap and water   Amount of cleaning:  Extensive   Debridement:  None Skin repair:    Repair method:  Tissue adhesive Approximation:    Approximation:  Loose Post-procedure details:    Procedure completion:  Tolerated well, no immediate complications     ____________________________________________   INITIAL IMPRESSION / ASSESSMENT AND PLAN / ED COURSE      Patient presents with above to history exam for assessment of 2 chief complaints.  On arrival he is slightly tachycardic with otherwise stable vital signs on room air.  With part of his laceration on his finger there are 2 lacerations in the distal aspect was repaired with some glue.  Tetanus was updated.  There is no evidence of retained foreign body on washout and exam is not significant with tendon injury and a very low suspicion for fracture or other significant or to the hand.  With regard to patient's reported chest pain shortness of breath and cough for last couple days differential includes ACS, PE, arrhythmia, pneumonia, CHF exacerbation, and bronchitis.  Explained this to the patient but he adamantly refused an ECG, chest x-ray or blood work stating he understood the risk of life-threatening causes for symptoms including heart attacks and blood clots.  After tetanus updated patient was discharged AGAINST MEDICAL ADVICE.  He had capacity to make this decision as he appeared clinically sober on the time of my assessment.  Advised him to seek immediate evaluation if he changes mind or otherwise follow-up with his primary care doctor soon as he is able to.  Patient voiced understanding and agreement this plan.  I was able to change patient's mind regarding further work-up in the ED today.   ____________________________________________   FINAL CLINICAL IMPRESSION(S) / ED  DIAGNOSES  Final diagnoses:  Laceration of left index finger without foreign body without damage to nail, initial encounter  Chest pain, unspecified type  SOB (shortness of breath)    Medications  lidocaine (PF) (XYLOCAINE) 1 % injection (has no administration in time range)  Tdap (BOOSTRIX) injection 0.5 mL (0.5 mLs Intramuscular Given 01/21/21 0126)     ED Discharge Orders    None       Note:  This document was prepared using Dragon voice recognition software and may include unintentional dictation errors.   Gilles Chiquito, MD 01/21/21 [phone]

## 2021-11-04 ENCOUNTER — Emergency Department
Admission: EM | Admit: 2021-11-04 | Discharge: 2021-11-04 | Disposition: A | Discharge status: Home / Self Care | Payer: Medicare Other | Attending: Emergency Medicine | Admitting: Emergency Medicine

## 2021-11-04 ENCOUNTER — Other Ambulatory Visit: Payer: Self-pay

## 2021-11-04 ENCOUNTER — Encounter: Payer: Self-pay | Admitting: Emergency Medicine

## 2021-11-04 ENCOUNTER — Emergency Department: Payer: Medicare Other

## 2021-11-04 DIAGNOSIS — R0602 Shortness of breath: Secondary | ICD-10-CM | POA: Insufficient documentation

## 2021-11-04 DIAGNOSIS — Z87891 Personal history of nicotine dependence: Secondary | ICD-10-CM | POA: Insufficient documentation

## 2021-11-04 DIAGNOSIS — J449 Chronic obstructive pulmonary disease, unspecified: Secondary | ICD-10-CM | POA: Insufficient documentation

## 2021-11-04 DIAGNOSIS — Z20822 Contact with and (suspected) exposure to covid-19: Secondary | ICD-10-CM | POA: Insufficient documentation

## 2021-11-04 DIAGNOSIS — R109 Unspecified abdominal pain: Secondary | ICD-10-CM

## 2021-11-04 DIAGNOSIS — I509 Heart failure, unspecified: Secondary | ICD-10-CM | POA: Insufficient documentation

## 2021-11-04 DIAGNOSIS — K5730 Diverticulosis of large intestine without perforation or abscess without bleeding: Secondary | ICD-10-CM | POA: Diagnosis not present

## 2021-11-04 DIAGNOSIS — Z7722 Contact with and (suspected) exposure to environmental tobacco smoke (acute) (chronic): Secondary | ICD-10-CM | POA: Insufficient documentation

## 2021-11-04 DIAGNOSIS — R519 Headache, unspecified: Secondary | ICD-10-CM | POA: Diagnosis not present

## 2021-11-04 DIAGNOSIS — R000 Tachycardia, unspecified: Secondary | ICD-10-CM | POA: Insufficient documentation

## 2021-11-04 DIAGNOSIS — N200 Calculus of kidney: Secondary | ICD-10-CM | POA: Insufficient documentation

## 2021-11-04 DIAGNOSIS — R1031 Right lower quadrant pain: Secondary | ICD-10-CM | POA: Diagnosis present

## 2021-11-04 LAB — COMPREHENSIVE METABOLIC PANEL
ALT: 29 U/L (ref 0–44)
AST: 29 U/L (ref 15–41)
Albumin: 4.4 g/dL (ref 3.5–5.0)
Alkaline Phosphatase: 59 U/L (ref 38–126)
Anion gap: 7 (ref 5–15)
BUN: 20 mg/dL (ref 6–20)
CO2: 27 mmol/L (ref 22–32)
Calcium: 9.3 mg/dL (ref 8.9–10.3)
Chloride: 103 mmol/L (ref 98–111)
Creatinine, Ser: 1.14 mg/dL (ref 0.61–1.24)
GFR, Estimated: >60 mL/min (ref >60)
Glucose, Bld: 92 mg/dL (ref 70–99)
Potassium: 4 mmol/L (ref 3.5–5.1)
Sodium: 137 mmol/L (ref 135–145)
Total Bilirubin: 1.2 mg/dL (ref 0.3–1.2)
Total Protein: 7.6 g/dL (ref 6.5–8.1)

## 2021-11-04 LAB — URINALYSIS, ROUTINE W REFLEX MICROSCOPIC
Bilirubin Urine: NEGATIVE
Glucose, UA: NEGATIVE mg/dL
Hgb urine dipstick: NEGATIVE
Ketones, ur: 5 mg/dL — AB
Leukocytes,Ua: NEGATIVE
Nitrite: NEGATIVE
Protein, ur: NEGATIVE mg/dL
Specific Gravity, Urine: 1.026 (ref 1.005–1.030)
pH: 5 (ref 5.0–8.0)

## 2021-11-04 LAB — CBC
HCT: 48.2 % (ref 39.0–52.0)
Hemoglobin: 16.3 g/dL (ref 13.0–17.0)
MCH: 28.8 pg (ref 26.0–34.0)
MCHC: 33.8 g/dL (ref 30.0–36.0)
MCV: 85.3 fL (ref 80.0–100.0)
Platelets: 362 10*3/uL (ref 150–400)
RBC: 5.65 MIL/uL (ref 4.22–5.81)
RDW: 12.6 % (ref 11.5–15.5)
WBC: 7.2 10*3/uL (ref 4.0–10.5)
nRBC: 0.3 % — ABNORMAL HIGH (ref 0.0–0.2)

## 2021-11-04 LAB — LIPASE, BLOOD: Lipase: 27 U/L (ref 11–51)

## 2021-11-04 LAB — RESP PANEL BY RT-PCR (FLU A&B, COVID) ARPGX2
Influenza A by PCR: NEGATIVE
Influenza B by PCR: NEGATIVE
SARS Coronavirus 2 by RT PCR: NEGATIVE

## 2021-11-04 LAB — TROPONIN I (HIGH SENSITIVITY): Troponin I (High Sensitivity): 3 ng/L (ref <18)

## 2021-11-04 MED ORDER — IOHEXOL 350 MG/ML SOLN
100.0000 mL | Freq: Once | INTRAVENOUS | Status: AC | PRN
  Administered 2021-11-04: 100 mL via INTRAVENOUS
  Filled 2021-11-04: qty 100

## 2021-11-04 MED ORDER — DROPERIDOL 2.5 MG/ML IJ SOLN
1.2500 mg | Freq: Once | INTRAMUSCULAR | Status: AC
  Administered 2021-11-04: 1.25 mg via INTRAVENOUS
  Filled 2021-11-04: qty 2

## 2021-11-04 MED ORDER — SODIUM CHLORIDE 0.9 % IV BOLUS
1000.0000 mL | Freq: Once | INTRAVENOUS | Status: AC
  Administered 2021-11-04: 1000 mL via INTRAVENOUS

## 2021-11-04 NOTE — ED Provider Notes (Signed)
[facility] Emergency Department Provider Note  ____________________________________________   Event Date/Time   First MD Initiated Contact with Patient 11/04/21 2104     (approximate)  I have reviewed the triage vital signs and the nursing notes.   HISTORY  Chief Complaint Abdominal Pain    HPI Reginald Hopkins is a 45 y.o. male with COPD, prior substance abuse now on Suboxone who comes in with pain.  Patient reports having severe pain in his right lower quadrant for 2 weeks, constant, nothing makes it better or worse.  The pain radiates up into his chest.  He also reports some shortness of breath and pain with taking a deep breath.  He also reports a severe headache.  He states that is not better with his Suboxone, nothing makes it worse.  Denies having this previously.  He states that he went to his primary care doctor and they told him that he had appendicitis and needed to come here to have his appendix removed.  Patient denies having any imaging done to prove that he actually has appendicitis.  He denies any testicle pain or swelling          Past Medical History:  Diagnosis Date   Anxiety    Back pain    CHF (congestive heart failure) (HCC)    COPD (chronic obstructive pulmonary disease) (HCC)    Opioid dependence (HCC) 05/2020   Takes Suboxone   PTSD (post-traumatic stress disorder)     Patient Active Problem List   Diagnosis Date Noted   Closed fracture of lateral malleolus 12/21/2019   Closed fracture of right ankle 12/10/2019   Closed fracture of shaft of metacarpal bone 03/01/2019   Compression fracture of vertebral column (HCC) 03/01/2019   Hand pain 03/01/2019   Low back pain 03/01/2019   Pain in face 03/01/2019   Sprain of ankle 03/01/2019   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   Opiate abuse, episodic (HCC) 09/14/2018   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   Acute respiratory failure with hypoxia (HCC) 09/12/2018    Acute respiratory failure (HCC) 09/12/2018   COPD exacerbation (HCC) 02/10/2018   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 Authorizing Provider  albuterol (VENTOLIN HFA) 108 (90 Base) MCG/ACT inhaler Inhale 2 puffs into the lungs every 6 (six) hours as needed for wheezing or shortness of breath. 08/20/19   Shaune Pollack, MD  amitriptyline (ELAVIL) 25 MG tablet TAKE 1 TABLET BY MOUTH EVERYDAY AT BEDTIME Patient taking differently: Take 25 mg by mouth at bedtime. TAKE 1 TABLET BY MOUTH EVERYDAY AT BEDTIME 03/13/19   Stoioff, Verna Czech, MD  azithromycin (ZITHROMAX) 250 MG tablet Take 250 mg by mouth as directed. 06/06/20   [provider]  buprenorphine-naloxone (SUBOXONE) 8-2 mg SUBL SL tablet 1 tablet 3 (three) times daily. 07/01/20   [provider]  clonazePAM (KLONOPIN) 2 MG tablet  3 mg. 1.5 Tablet(s) By Mouth Daily    [provider]  cyclobenzaprine (FLEXERIL) 10 MG tablet Take 10 mg by mouth 3 (three) times daily as needed. 04/07/20   [provider]  diclofenac (VOLTAREN) 75 MG EC tablet Take 75 mg by mouth 2 (two) times daily. 07/14/20   [provider]  Fluticasone-Salmeterol (ADVAIR DISKUS) 250-50 MCG/DOSE AEPB Advair Diskus 250 mcg-50 mcg/dose powder for inhalation    [provider]  gabapentin (NEURONTIN) 600 MG tablet Take 600 mg by mouth 3  (three) times daily. 05/30/20   [provider]  INCRUSE ELLIPTA 62.5 MCG/INH AEPB 1 puff daily. 05/19/20   [provider]  ketorolac (TORADOL) 10 MG tablet Take 1 tablet (10 mg total) by mouth every 6 (six) hours as needed. 06/11/20   Chesley Noon, MD  levofloxacin (LEVAQUIN) 750 MG tablet Take 750 mg by mouth daily. 05/30/20   [provider]  megestrol (MEGACE) 40 MG tablet Take 40 mg by mouth 2 (two) times daily. 04/30/20   [provider]  meloxicam (MOBIC) 15 MG tablet Take 1 tablet (15 mg total) by mouth daily. 12/04/19   Cuthriell, Delorise Royals, PA-C  naloxone Jonelle Sports) 0.4 MG/ML injection As needed for opiate overdose 08/20/19   Shaune Pollack, MD  predniSONE (DELTASONE) 20 MG tablet Take 20 mg by mouth 2 (two) times daily with a meal.     [provider]  promethazine (PHENERGAN) 12.5 MG tablet Take 1 tablet (12.5 mg total) by mouth every 6 (six) hours as needed for nausea or vomiting. 06/11/20   Chesley Noon, MD  rOPINIRole (REQUIP) 0.5 MG tablet Take 0.5 mg by mouth daily. 02/05/20   [provider]  tamsulosin (FLOMAX) 0.4 MG CAPS capsule Take 1 capsule (0.4 mg total) by mouth daily after supper. 07/16/20   Jerilee Field, MD  traMADol (ULTRAM) 50 MG tablet Take 100 mg by mouth every 6 (six) hours as needed. 01/25/20   [provider]    Allergies Patient has no known allergies.  Family History  Problem Relation Age of Onset   Hypertension Father     Social History Social History   Tobacco Use   Smoking status: Former   Smokeless tobacco: Current  Building services engineer Use: Never used  Substance Use Topics   Alcohol use: Yes      Review of Systems Constitutional: No fever/chills Eyes: No visual changes. ENT: No sore throat. Cardiovascular: Positive chest pain Respiratory: Positive shortness of Gastrointestinal: Positive abdominal pain no nausea, no vomiting.  No diarrhea.  No constipation. Genitourinary:  Negative for dysuria. Musculoskeletal: Negative for back pain. Skin: Negative for rash. Neurological: Positive headache focal weakness or numbness. All other ROS negative ____________________________________________   PHYSICAL EXAM:  VITAL SIGNS: ED Triage Vitals  Enc Vitals Group     BP 11/04/21 1620 (!) 151/91     Pulse Rate 11/04/21 1620 (!) 116     Resp 11/04/21 1620 18     Temp 11/04/21 1620 98.3 F (36.8 C)     Temp Source 11/04/21 1620 Oral     SpO2 11/04/21 1620 97 %     Weight 11/04/21 1607 150 lb (68 kg)     Height 11/04/21 1607 5\' 11"  (1.803 m)     Head Circumference --      Peak Flow --      Pain Score 11/04/21 1607 10     Pain Loc --      Pain Edu? --  Excl. in GC? --     Constitutional: Alert and oriented. Well appearing and in no acute distress. Eyes: Conjunctivae are normal. EOMI. Head: Atraumatic. Nose: No congestion/rhinnorhea. Mouth/Throat: Mucous membranes are moist.   Neck: No stridor. Trachea Midline. FROM Cardiovascular: Tachycardic, regular rhythm. Grossly normal heart sounds.  Good peripheral circulation. Respiratory: Normal respiratory effort.  No retractions. Lungs CTAB. Gastrointestinal: Tender in the right lower quadrant.  No distention. No abdominal bruits.  Musculoskeletal: No lower extremity tenderness nor edema.  No joint effusions. Neurologic:  Normal speech and language. No gross focal neurologic deficits are appreciated.  Skin:  Skin is warm, dry and intact. No rash noted. Psychiatric: Mood and affect are normal. Speech and behavior are normal. GU: Deferred   ____________________________________________   LABS (all labs ordered are listed, but only abnormal results are displayed)  Labs Reviewed  CBC - Abnormal; Notable for the following components:      Result Value   nRBC 0.3 (*)    All other components within normal limits  URINALYSIS, ROUTINE W REFLEX MICROSCOPIC - Abnormal; Notable for the following components:    Color, Urine YELLOW (*)    APPearance CLEAR (*)    Ketones, ur 5 (*)    All other components within normal limits  RESP PANEL BY RT-PCR (FLU A&B, COVID) ARPGX2  LIPASE, BLOOD  COMPREHENSIVE METABOLIC PANEL  TROPONIN I (HIGH SENSITIVITY)   ____________________________________________   ED ECG REPORT I, Concha Se, the attending physician, personally viewed and interpreted this ECG.  Normal sinus rate of 78, no ST elevation, no T wave versions, normal intervals ____________________________________________  RADIOLOGY Pending  ____________________________________________   PROCEDURES  Procedure(s) performed (including Critical Care):  Procedures   ____________________________________________   INITIAL IMPRESSION / ASSESSMENT AND PLAN / ED COURSE  Christoval Heckmann was evaluated in Emergency Department on 11/04/2021 for the symptoms described in the history of present illness. He was evaluated in the context of the global COVID-19 pandemic, which necessitated consideration that the patient might be at risk for infection with the SARS-CoV-2 virus that causes COVID-19. Institutional protocols and algorithms that pertain to the evaluation of patients at risk for COVID-19 are in a state of rapid change based on information released by regulatory bodies including the CDC and federal and state organizations. These policies and algorithms were followed during the patient's care in the ED.    Patient comes in with severe abdominal pain.  Will get CT scan to evaluate for appendicitis, diverticulitis.  Pain seems to be more lower than upper to lower suspicion for gallbladder CT will show there is any evidence of cholecystitis.  He also reports some shortness of breath and pain with deep breaths.  He is tachycardic.  Given we will get the CT scan of his abdomen we will get a CT PE to make sure no evidence of pulmonary embolism and in case he needs to be heparinized given he is having the  severe headaches will get CT head evaluate for any intercranial hemorrhage.  Given patient has substance abuse issues previously we will give him a dose of droperidol to try to avoid IV narcotics.  Patient is agreeable to this plan.  Patient will be handed off to oncoming team pending these results   ____________________________________________   FINAL CLINICAL IMPRESSION(S) / ED DIAGNOSES   Final diagnoses:  Abdominal pain, unspecified abdominal location      MEDICATIONS GIVEN DURING THIS VISIT:  Medications  sodium chloride 0.9 % bolus 1,000 mL (has  no administration in time range)  droperidol (INAPSINE) 2.5 MG/ML injection 1.25 mg (has no administration in time range)     ED Discharge Orders     None        Note:  This document was prepared using Dragon voice recognition software and may include unintentional dictation errors.    Concha Se, MD 11/04/21 2125

## 2021-11-04 NOTE — ED Triage Notes (Signed)
Pt reports abd pain, HA and NV for the last few days. Pt reports went to his PMD and was advised to come to the ED to rule out appendicitis

## 2021-11-04 NOTE — ED Provider Notes (Signed)
Patient reevaluated after imaging.  Patient sleeping, no distress.  Patient still complaining of some right lower quadrant abdominal pain, no back pain nausea or vomiting.  Physical Exam  BP 118/77 (BP Location: Right Arm)   Pulse 100   Temp 98.3 F (36.8 C) (Oral)   Resp 16   Ht 5\' 11"  (1.803 m)   Wt 68 kg   SpO2 98%   BMI 20.92 kg/m   Physical Exam Patient in no distress.  Moves well with no signs of grimacing.  Ambulatory with no antalgic gait ED Course/Procedures     Procedures EXAM: CT ANGIOGRAPHY CHEST   CT ABDOMEN AND PELVIS WITH CONTRAST   TECHNIQUE: Multidetector CT imaging of the chest was performed using the standard protocol during bolus administration of intravenous contrast. Multiplanar CT image reconstructions and MIPs were obtained to evaluate the vascular anatomy. Multidetector CT imaging of the abdomen and pelvis was performed using the standard protocol during bolus administration of intravenous contrast.   CONTRAST:  OMNIPAQUE IOHEXOL 350 MG/ML SOLN   COMPARISON:  06/11/2020, 12/04/2019, 06/16/2014   FINDINGS: CTA CHEST FINDINGS   Cardiovascular: This is a technically adequate evaluation of the pulmonary vasculature. No filling defects or pulmonary emboli.   The heart is unremarkable without pericardial effusion. No evidence of thoracic aortic aneurysm or dissection.   Mediastinum/Nodes: No enlarged mediastinal, hilar, or axillary lymph nodes. Thyroid gland, trachea, and esophagus demonstrate no significant findings.   Lungs/Pleura: Upper lobe predominant bullous emphysematous changes are noted. At the left apex there is a 2.1 x 0.5 x 0.9 area of pleural thickening, not appreciably changed since prior cervical spine CT 12/04/2019. This is most consistent with scarring.   No acute airspace disease, effusion, or pneumothorax. Central airways are patent.   Musculoskeletal: No acute or destructive bony lesions. Reconstructed images  demonstrate no additional findings.   Review of the MIP images confirms the above findings.   CT ABDOMEN and PELVIS FINDINGS   Hepatobiliary: No focal liver abnormality is seen. No gallstones, gallbladder wall thickening, or biliary dilatation.   Pancreas: Unremarkable. No pancreatic ductal dilatation or surrounding inflammatory changes.   Spleen: Normal in size without focal abnormality.   Adrenals/Urinary Tract: Punctate 2 mm nonobstructing calculus within the mid left kidney. No right-sided calculi. No obstructive uropathy. Subcentimeter right renal cortical hypodensity too small to characterize though most likely a cyst. The adrenals and bladder are unremarkable.   Stomach/Bowel: No bowel obstruction or ileus. Normal gas-filled appendix is seen within the right lower quadrant. No bowel wall thickening or inflammatory changes. Minimal sigmoid diverticulosis without diverticulitis.   Vascular/Lymphatic: No significant vascular findings are present. No enlarged abdominal or pelvic lymph nodes.   Reproductive: Prostate is unremarkable.   Other: No free fluid or free gas.  No abdominal wall hernia.   Musculoskeletal: No acute or destructive bony lesions. Chronic L1 compression deformity. Reconstructed images demonstrate no additional findings.   Review of the MIP images confirms the above findings.   IMPRESSION: 1. No evidence of pulmonary embolus. 2. Chronic left apical pleural thickening, most compatible with scarring. 3. Punctate 2 mm nonobstructing left renal calculus. 4. Normal appendix. 5. Minimal sigmoid diverticulosis without diverticulitis. 6.  Emphysema (ICD10-J43.9).     Electronically Signed   By: 12/06/2019 M.D.   On: 11/04/2021 22:15  EXAM: CT HEAD WITHOUT CONTRAST   TECHNIQUE: Contiguous axial images were obtained from the base of the skull through the vertex without intravenous contrast.   COMPARISON:  Head CT  dated 06/17/2010.    FINDINGS: Brain: The ventricles and sulci are appropriate size for the patient's age. The gray-white matter discrimination is preserved. There is no acute intracranial hemorrhage. No mass effect or midline shift. No extra-axial fluid collection.   Vascular: No hyperdense vessel or unexpected calcification.   Skull: No acute calvarial pathology. Prior fixation of after table the frontal sinus.   Sinuses/Orbits: The visualized paranasal sinuses and mastoid air cells are clear.   Other: None   IMPRESSION: Unremarkable noncontrast CT of the brain.     Electronically Signed   By: Elgie Collard M.D.   On: 11/04/2021 22:49   MDM  45 year old male with right lower quadrant abdominal pain.  Patient's lab work reassuring with no leukocytosis.  Negative troponins, EKG.  He has normal vital signs, afebrile nontachycardic.  Patient had negative CT of the chest abdomen pelvis as well as CT head with no acute findings.  Urinalysis negative.  Patient discharged home in stable condition, will take over-the-counter medications as needed for pain and follow-up with PCP.  He understands signs symptoms return to the ER for.       Evon Slack, PA-C 11/04/21 2243    Concha Se, MD 11/05/21 1515

## 2021-11-04 NOTE — Discharge Instructions (Signed)
Please take over-the-counter medications as needed for pain.  Return to the ER for any severe increasing pain, fevers, worsening symptoms or urgent changes in health.

## 2023-01-13 DEATH — deceased
